# Patient Record
Sex: Female | Born: 1965 | Race: White | Hispanic: No | Smoking: Former smoker
Health system: Southern US, Community
[De-identification: ages and names within clinical notes are randomized; demographics above are authoritative.]

## PROBLEM LIST (undated history)

## (undated) DIAGNOSIS — E119 Type 2 diabetes mellitus without complications: Secondary | ICD-10-CM

## (undated) DIAGNOSIS — M150 Primary generalized (osteo)arthritis: Secondary | ICD-10-CM

## (undated) DIAGNOSIS — F32A Depression, unspecified: Secondary | ICD-10-CM

## (undated) DIAGNOSIS — J45909 Unspecified asthma, uncomplicated: Secondary | ICD-10-CM

## (undated) DIAGNOSIS — G5600 Carpal tunnel syndrome, unspecified upper limb: Secondary | ICD-10-CM

## (undated) DIAGNOSIS — F329 Major depressive disorder, single episode, unspecified: Secondary | ICD-10-CM

## (undated) DIAGNOSIS — M1990 Unspecified osteoarthritis, unspecified site: Secondary | ICD-10-CM

## (undated) HISTORY — DX: Depression, unspecified: F32.A

## (undated) HISTORY — DX: Major depressive disorder, single episode, unspecified: F32.9

## (undated) HISTORY — DX: Type 2 diabetes mellitus without complications: E11.9

## (undated) HISTORY — DX: Unspecified asthma, uncomplicated: J45.909

---
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
## 1987-03-14 HISTORY — PX: TUBAL LIGATION: SHX77

## 1998-02-18 ENCOUNTER — Other Ambulatory Visit: Admission: RE | Admit: 1998-02-18 | Discharge: 1998-02-18 | Payer: Self-pay | Admitting: *Deleted

## 1999-04-07 ENCOUNTER — Other Ambulatory Visit: Admission: RE | Admit: 1999-04-07 | Discharge: 1999-04-07 | Payer: Self-pay | Admitting: *Deleted

## 2000-07-26 ENCOUNTER — Other Ambulatory Visit: Admission: RE | Admit: 2000-07-26 | Discharge: 2000-07-26 | Payer: Self-pay | Admitting: *Deleted

## 2001-09-06 ENCOUNTER — Encounter: Payer: Self-pay | Admitting: Family Medicine

## 2001-09-06 ENCOUNTER — Ambulatory Visit (HOSPITAL_COMMUNITY): Admission: RE | Admit: 2001-09-06 | Discharge: 2001-09-06 | Payer: Self-pay | Admitting: Family Medicine

## 2002-03-11 ENCOUNTER — Other Ambulatory Visit: Admission: RE | Admit: 2002-03-11 | Discharge: 2002-03-11 | Payer: Self-pay | Admitting: Gynecology

## 2002-03-18 ENCOUNTER — Encounter: Admission: RE | Admit: 2002-03-18 | Discharge: 2002-03-18 | Payer: Self-pay | Admitting: Gynecology

## 2002-03-18 ENCOUNTER — Encounter: Payer: Self-pay | Admitting: Gynecology

## 2004-10-12 ENCOUNTER — Emergency Department (HOSPITAL_COMMUNITY): Admission: EM | Admit: 2004-10-12 | Discharge: 2004-10-12 | Payer: Self-pay | Admitting: Emergency Medicine

## 2005-05-17 ENCOUNTER — Ambulatory Visit (HOSPITAL_COMMUNITY): Admission: RE | Admit: 2005-05-17 | Discharge: 2005-05-17 | Payer: Self-pay | Admitting: Family Medicine

## 2005-06-05 ENCOUNTER — Ambulatory Visit: Payer: Self-pay | Admitting: Orthopedic Surgery

## 2006-07-04 ENCOUNTER — Other Ambulatory Visit: Admission: RE | Admit: 2006-07-04 | Discharge: 2006-07-04 | Payer: Self-pay | Admitting: Gynecology

## 2009-03-15 ENCOUNTER — Ambulatory Visit (HOSPITAL_COMMUNITY): Admission: RE | Admit: 2009-03-15 | Discharge: 2009-03-15 | Payer: Self-pay | Admitting: Family Medicine

## 2009-07-25 ENCOUNTER — Encounter: Admission: RE | Admit: 2009-07-25 | Discharge: 2009-07-25 | Payer: Self-pay | Admitting: Family Medicine

## 2010-03-21 ENCOUNTER — Other Ambulatory Visit
Admission: RE | Admit: 2010-03-21 | Discharge: 2010-03-21 | Payer: Self-pay | Source: Home / Self Care | Admitting: Obstetrics & Gynecology

## 2010-04-03 ENCOUNTER — Encounter: Payer: Self-pay | Admitting: Family Medicine

## 2011-04-19 ENCOUNTER — Other Ambulatory Visit (HOSPITAL_COMMUNITY): Payer: Self-pay | Admitting: Family Medicine

## 2011-04-19 DIAGNOSIS — Z139 Encounter for screening, unspecified: Secondary | ICD-10-CM

## 2011-04-24 ENCOUNTER — Ambulatory Visit (HOSPITAL_COMMUNITY): Payer: Self-pay

## 2011-10-09 ENCOUNTER — Ambulatory Visit (HOSPITAL_COMMUNITY)
Admission: RE | Admit: 2011-10-09 | Discharge: 2011-10-09 | Disposition: A | Discharge status: Home / Self Care | Payer: BC Managed Care – PPO | Source: Ambulatory Visit | Attending: Family Medicine | Admitting: Family Medicine

## 2011-10-09 DIAGNOSIS — Z1231 Encounter for screening mammogram for malignant neoplasm of breast: Secondary | ICD-10-CM | POA: Insufficient documentation

## 2011-10-09 DIAGNOSIS — Z139 Encounter for screening, unspecified: Secondary | ICD-10-CM

## 2012-05-03 ENCOUNTER — Other Ambulatory Visit: Payer: Self-pay | Admitting: Obstetrics and Gynecology

## 2013-08-27 ENCOUNTER — Other Ambulatory Visit (HOSPITAL_COMMUNITY): Payer: Self-pay | Admitting: Internal Medicine

## 2013-08-27 DIAGNOSIS — M549 Dorsalgia, unspecified: Secondary | ICD-10-CM

## 2013-08-29 ENCOUNTER — Ambulatory Visit (HOSPITAL_COMMUNITY): Admission: RE | Admit: 2013-08-29 | Payer: BC Managed Care – PPO | Source: Ambulatory Visit

## 2013-10-20 ENCOUNTER — Other Ambulatory Visit (HOSPITAL_COMMUNITY): Payer: Self-pay | Admitting: Internal Medicine

## 2013-10-20 DIAGNOSIS — Z139 Encounter for screening, unspecified: Secondary | ICD-10-CM

## 2013-10-27 ENCOUNTER — Ambulatory Visit (HOSPITAL_COMMUNITY)
Admission: RE | Admit: 2013-10-27 | Discharge: 2013-10-27 | Disposition: A | Discharge status: Home / Self Care | Payer: BC Managed Care – PPO | Source: Ambulatory Visit | Attending: Internal Medicine | Admitting: Internal Medicine

## 2013-10-27 DIAGNOSIS — Z139 Encounter for screening, unspecified: Secondary | ICD-10-CM

## 2013-10-27 DIAGNOSIS — Z1231 Encounter for screening mammogram for malignant neoplasm of breast: Secondary | ICD-10-CM | POA: Diagnosis present

## 2014-05-06 ENCOUNTER — Other Ambulatory Visit: Payer: Self-pay | Admitting: Orthopedic Surgery

## 2014-06-09 ENCOUNTER — Ambulatory Visit (HOSPITAL_BASED_OUTPATIENT_CLINIC_OR_DEPARTMENT_OTHER)
Admission: RE | Admit: 2014-06-09 | Payer: BLUE CROSS/BLUE SHIELD | Source: Ambulatory Visit | Admitting: Orthopedic Surgery

## 2014-06-09 ENCOUNTER — Encounter (HOSPITAL_BASED_OUTPATIENT_CLINIC_OR_DEPARTMENT_OTHER): Admission: RE | Payer: Self-pay | Source: Ambulatory Visit

## 2014-06-09 SURGERY — CARPAL TUNNEL RELEASE
Anesthesia: Choice | Laterality: Left

## 2014-10-02 ENCOUNTER — Other Ambulatory Visit (HOSPITAL_COMMUNITY): Payer: Self-pay | Admitting: Family Medicine

## 2014-10-02 ENCOUNTER — Ambulatory Visit (HOSPITAL_COMMUNITY)
Admission: RE | Admit: 2014-10-02 | Discharge: 2014-10-02 | Disposition: A | Discharge status: Home / Self Care | Payer: BLUE CROSS/BLUE SHIELD | Source: Ambulatory Visit | Attending: Family Medicine | Admitting: Family Medicine

## 2014-10-02 DIAGNOSIS — R05 Cough: Secondary | ICD-10-CM | POA: Insufficient documentation

## 2014-10-02 DIAGNOSIS — R059 Cough, unspecified: Secondary | ICD-10-CM

## 2014-10-02 DIAGNOSIS — R0602 Shortness of breath: Secondary | ICD-10-CM | POA: Insufficient documentation

## 2014-10-02 DIAGNOSIS — R109 Unspecified abdominal pain: Secondary | ICD-10-CM

## 2014-10-02 DIAGNOSIS — R079 Chest pain, unspecified: Secondary | ICD-10-CM | POA: Insufficient documentation

## 2015-12-13 ENCOUNTER — Ambulatory Visit: Payer: Self-pay | Admitting: Physician Assistant

## 2015-12-13 ENCOUNTER — Encounter: Payer: Self-pay | Admitting: Physician Assistant

## 2015-12-13 VITALS — BP 146/66 | HR 92 | Temp 98.1°F | Ht 64.25 in | Wt 236.2 lb

## 2015-12-13 DIAGNOSIS — F329 Major depressive disorder, single episode, unspecified: Secondary | ICD-10-CM | POA: Insufficient documentation

## 2015-12-13 DIAGNOSIS — F32A Depression, unspecified: Secondary | ICD-10-CM | POA: Insufficient documentation

## 2015-12-13 DIAGNOSIS — F419 Anxiety disorder, unspecified: Secondary | ICD-10-CM | POA: Insufficient documentation

## 2015-12-13 DIAGNOSIS — R059 Cough, unspecified: Secondary | ICD-10-CM

## 2015-12-13 DIAGNOSIS — F17219 Nicotine dependence, cigarettes, with unspecified nicotine-induced disorders: Secondary | ICD-10-CM

## 2015-12-13 DIAGNOSIS — Z13220 Encounter for screening for lipoid disorders: Secondary | ICD-10-CM

## 2015-12-13 DIAGNOSIS — Z1239 Encounter for other screening for malignant neoplasm of breast: Secondary | ICD-10-CM

## 2015-12-13 DIAGNOSIS — R05 Cough: Secondary | ICD-10-CM

## 2015-12-13 DIAGNOSIS — R030 Elevated blood-pressure reading, without diagnosis of hypertension: Secondary | ICD-10-CM

## 2015-12-13 DIAGNOSIS — Z131 Encounter for screening for diabetes mellitus: Secondary | ICD-10-CM

## 2015-12-13 LAB — GLUCOSE, POCT (MANUAL RESULT ENTRY): POC Glucose: 121 mg/dl — AB (ref 70–99)

## 2015-12-13 MED ORDER — CITALOPRAM HYDROBROMIDE 20 MG PO TABS: 20.0000 mg | ORAL_TABLET | Freq: Every day | ORAL | 0 refills | Status: DC

## 2015-12-13 MED ORDER — ALBUTEROL SULFATE HFA 108 (90 BASE) MCG/ACT IN AERS: 2.0000 | INHALATION_SPRAY | Freq: Four times a day (QID) | RESPIRATORY_TRACT | 1 refills | Status: DC | PRN

## 2015-12-13 NOTE — Patient Instructions (Addendum)
Citalopram (celexa) at [facility] Get labs/blood drawn (fasting) Someone will call for mammogram appointment Turn in your [facility] discount application Get chest xray (radiology department at [facility])

## 2015-12-13 NOTE — Progress Notes (Signed)
BP (!) 146/66 (BP Location: Left Arm, Patient Position: Sitting, Cuff Size: Large)   Pulse 92   Temp 98.1 F (36.7 C)   Ht 5' 4.25" (1.632 m)   Wt 236 lb 4 oz (107.2 kg)   SpO2 99%   BMI 40.24 kg/m    Subjective:    Patient ID: Lori Calderon, female    DOB: [birth year], 50 y.o.   MRN: [id number]  HPI: Lori Calderon is a 50 y.o. female presenting on 12/13/2015 for New Patient (Initial Visit) (previous pt at [facility]. pt lost employment and insurance and could no longer afford to go there)   HPI  Pt's mother with her today  Pt says it's been a year or more since she last went to [facility]  Pt was previously employed.  Pt states some depression.  In the past she was on Either lexapro or celexa. It worked well for her.   Pt states never on bp medication.   Pt c/o cough for about a year or year and a half.  Had cxr 10/02/14. Denies wheezing. Some sob- mostly when she is active.  She doesn't take anything for her cough.  She is a smoker  Relevant past medical, surgical, family and social history reviewed and updated as indicated. Interim medical history since our last visit reviewed. Allergies and medications reviewed and updated.  No current outpatient prescriptions on file.   Review of Systems  Constitutional: Negative for appetite change, chills, diaphoresis, fatigue, fever and unexpected weight change.  HENT: Negative for congestion, dental problem, drooling, ear pain, facial swelling, hearing loss, mouth sores, sneezing, sore throat, trouble swallowing and voice change.   Eyes: Negative for pain, discharge, redness, itching and visual disturbance.  Respiratory: Positive for cough and shortness of breath. Negative for choking and wheezing.   Cardiovascular: Negative for chest pain, palpitations and leg swelling.  Gastrointestinal: Negative for abdominal pain, blood in stool, constipation, diarrhea and vomiting.  Endocrine: Negative for cold intolerance, heat intolerance  and polydipsia.  Genitourinary: Negative for decreased urine volume, dysuria and hematuria.  Musculoskeletal: Positive for arthralgias, back pain and gait problem.  Skin: Negative for rash.  Allergic/Immunologic: Negative for environmental allergies.  Neurological: Positive for headaches. Negative for seizures, syncope and light-headedness.  Hematological: Negative for adenopathy.  Psychiatric/Behavioral: Positive for agitation and dysphoric mood. Negative for suicidal ideas. The patient is nervous/anxious.     Per HPI unless specifically indicated above     Objective:    BP (!) 146/66 (BP Location: Left Arm, Patient Position: Sitting, Cuff Size: Large)   Pulse 92   Temp 98.1 F (36.7 C)   Ht 5' 4.25" (1.632 m)   Wt 236 lb 4 oz (107.2 kg)   SpO2 99%   BMI 40.24 kg/m   Wt Readings from Last 3 Encounters:  12/13/15 236 lb 4 oz (107.2 kg)    phq-9 score 20 Gad-7 score 16  Physical Exam  Constitutional: She is oriented to person, place, and time. She appears well-developed and well-nourished.  HENT:  Head: Normocephalic and atraumatic.  Mouth/Throat: Oropharynx is clear and moist. No oropharyngeal exudate.  Eyes: Conjunctivae and EOM are normal. Pupils are equal, round, and reactive to light.  Neck: Neck supple. No thyromegaly present.  Cardiovascular: Normal rate and regular rhythm.   Pulmonary/Chest: Effort normal and breath sounds normal.  Abdominal: Soft. Bowel sounds are normal. She exhibits no mass. There is no hepatosplenomegaly. There is no tenderness.  Musculoskeletal: She exhibits no edema.  Lymphadenopathy:    She has no cervical adenopathy.  Neurological: She is alert and oriented to person, place, and time. Gait normal.  Skin: Skin is warm and dry.  Psychiatric: She has a normal mood and affect. Her behavior is normal.  Vitals reviewed.   Results for orders placed or performed in visit on 12/13/15  POCT Glucose (CBG)  Result Value Ref Range   POC Glucose  121 (A) 70 - 99 mg/dl      Assessment & Plan:   Encounter Diagnoses  Name Primary?  . Depression, unspecified depression type Yes  . Cough   . Cigarette nicotine dependence with nicotine-induced disorder   . Anxiety   . Elevated blood-pressure reading without diagnosis of hypertension   . Screening for diabetes mellitus (DM)   . Screening cholesterol level   . Screening for breast cancer      -order screeening Mammogram -Also needs pap- will plan on doing this in next appointment or two -pt Needs cxr -pt was given gave [facility] discount application.   -will get pt signed up for [facility] -order albuterol mdi from [facility] -pt will go to lab tomorrow morning for Baseline labs -Likely copd causing cough but need to evaluate- may need CT after [facility] discount approved -pt was given rx for citalopram.  She was given contact information for [facility] including information about walk-in services -discussed with pt about smoking cessation -follow up 3 weeks.  RTO sooner prn

## 2015-12-20 ENCOUNTER — Ambulatory Visit (HOSPITAL_COMMUNITY)
Admission: RE | Admit: 2015-12-20 | Discharge: 2015-12-20 | Disposition: A | Discharge status: Home / Self Care | Payer: BLUE CROSS/BLUE SHIELD | Source: Ambulatory Visit | Attending: Physician Assistant | Admitting: Physician Assistant

## 2015-12-21 LAB — CBC WITH DIFFERENTIAL/PLATELET
Basophils Absolute: 42 cells/uL (ref 0–200)
Basophils Relative: 1 %
Eosinophils Absolute: 126 cells/uL (ref 15–500)
Eosinophils Relative: 3 %
HCT: 41.7 % (ref 35.0–45.0)
Hemoglobin: 13.8 g/dL (ref 11.7–15.5)
Lymphocytes Relative: 42 %
Lymphs Abs: 1764 cells/uL (ref 850–3900)
MCH: 30.1 pg (ref 27.0–33.0)
MCHC: 33.1 g/dL (ref 32.0–36.0)
MCV: 90.8 fL (ref 80.0–100.0)
MPV: 8.7 fL (ref 7.5–12.5)
Monocytes Absolute: 210 cells/uL (ref 200–950)
Monocytes Relative: 5 %
Neutro Abs: 2058 cells/uL (ref 1500–7800)
Neutrophils Relative %: 49 %
Platelets: 259 10*3/uL (ref 140–400)
RBC: 4.59 MIL/uL (ref 3.80–5.10)
RDW: 14.5 % (ref 11.0–15.0)
WBC: 4.2 10*3/uL (ref 3.8–10.8)

## 2015-12-22 LAB — COMPREHENSIVE METABOLIC PANEL
ALT: 27 U/L (ref 6–29)
AST: 22 U/L (ref 10–35)
Albumin: 4 g/dL (ref 3.6–5.1)
Alkaline Phosphatase: 75 U/L (ref 33–130)
BUN: 15 mg/dL (ref 7–25)
CO2: 27 mmol/L (ref 20–31)
Calcium: 9.1 mg/dL (ref 8.6–10.4)
Chloride: 103 mmol/L (ref 98–110)
Creat: 0.88 mg/dL (ref 0.50–1.05)
Glucose, Bld: 89 mg/dL (ref 65–99)
Potassium: 4.8 mmol/L (ref 3.5–5.3)
Sodium: 137 mmol/L (ref 135–146)
Total Bilirubin: 0.5 mg/dL (ref 0.2–1.2)
Total Protein: 6.7 g/dL (ref 6.1–8.1)

## 2015-12-22 LAB — HEMOGLOBIN A1C
Hgb A1c MFr Bld: 5.6 % (ref <5.7)
Mean Plasma Glucose: 114 mg/dL

## 2015-12-22 LAB — LIPID PANEL
Cholesterol: 207 mg/dL — ABNORMAL HIGH (ref 125–200)
HDL: 31 mg/dL — ABNORMAL LOW (ref >=46)
LDL Cholesterol: 140 mg/dL — ABNORMAL HIGH (ref <130)
Total CHOL/HDL Ratio: 6.7 Ratio — ABNORMAL HIGH (ref <=5.0)
Triglycerides: 182 mg/dL — ABNORMAL HIGH (ref <150)
VLDL: 36 mg/dL — ABNORMAL HIGH (ref <30)

## 2015-12-22 LAB — TSH: TSH: 1.89 mIU/L

## 2016-01-03 ENCOUNTER — Encounter: Payer: Self-pay | Admitting: Physician Assistant

## 2016-01-03 ENCOUNTER — Ambulatory Visit: Payer: Self-pay | Admitting: Physician Assistant

## 2016-01-03 VITALS — BP 116/60 | HR 94 | Temp 97.9°F | Wt 238.5 lb

## 2016-01-03 DIAGNOSIS — F32A Depression, unspecified: Secondary | ICD-10-CM

## 2016-01-03 DIAGNOSIS — J449 Chronic obstructive pulmonary disease, unspecified: Secondary | ICD-10-CM

## 2016-01-03 DIAGNOSIS — R05 Cough: Secondary | ICD-10-CM

## 2016-01-03 DIAGNOSIS — R059 Cough, unspecified: Secondary | ICD-10-CM

## 2016-01-03 DIAGNOSIS — F17219 Nicotine dependence, cigarettes, with unspecified nicotine-induced disorders: Secondary | ICD-10-CM

## 2016-01-03 DIAGNOSIS — E785 Hyperlipidemia, unspecified: Secondary | ICD-10-CM

## 2016-01-03 DIAGNOSIS — F329 Major depressive disorder, single episode, unspecified: Secondary | ICD-10-CM

## 2016-01-03 MED ORDER — CITALOPRAM HYDROBROMIDE 20 MG PO TABS: 20.0000 mg | ORAL_TABLET | Freq: Every day | ORAL | 0 refills | Status: DC

## 2016-01-03 NOTE — Patient Instructions (Signed)

## 2016-01-03 NOTE — Progress Notes (Signed)
BP 116/60   Pulse 94   Temp 97.9 F (36.6 C)   Wt 238 lb 8 oz (108.2 kg)   SpO2 93%   BMI 40.62 kg/m    Subjective:    Patient ID: Lori Calderon, female    DOB: 07/26/1965, 50 y.o.   MRN: [id number]  HPI: Lori Calderon is a 50 y.o. female presenting on 01/03/2016 for No chief complaint on file.   HPI   Pt did not get cxr.  She says she turned in her [facility] discount application  She did not contact [facility].  She says citalopram is helping her mood.  She says it makes her thirsty but her mood is much better.   Relevant past medical, surgical, family and social history reviewed and updated as indicated. Interim medical history since our last visit reviewed. Allergies and medications reviewed and updated.   Current Outpatient Prescriptions:  .  citalopram (CELEXA) 20 MG tablet, Take 1 tablet (20 mg total) by mouth daily., Disp: 30 tablet, Rfl: 0 .  albuterol (PROVENTIL HFA;VENTOLIN HFA) 108 (90 Base) MCG/ACT inhaler, Inhale 2 puffs into the lungs every 6 (six) hours as needed for wheezing or shortness of breath. (Patient not taking: Reported on 01/03/2016), Disp: 3 Inhaler, Rfl: 1   Review of Systems  Constitutional: Positive for appetite change, diaphoresis and fatigue. Negative for chills, fever and unexpected weight change.  HENT: Negative for congestion, drooling, ear pain, facial swelling, hearing loss, mouth sores, sneezing, sore throat, trouble swallowing and voice change.   Eyes: Negative for pain, discharge, redness, itching and visual disturbance.  Respiratory: Positive for cough and shortness of breath. Negative for choking and wheezing.   Cardiovascular: Negative for chest pain, palpitations and leg swelling.  Gastrointestinal: Negative for abdominal pain, blood in stool, constipation, diarrhea and vomiting.  Endocrine: Positive for polydipsia. Negative for cold intolerance and heat intolerance.  Genitourinary: Negative for decreased urine volume, dysuria and  hematuria.  Musculoskeletal: Negative for arthralgias, back pain and gait problem.  Skin: Negative for rash.  Allergic/Immunologic: Negative for environmental allergies.  Neurological: Negative for seizures, syncope, light-headedness and headaches.  Hematological: Negative for adenopathy.  Psychiatric/Behavioral: Positive for agitation and dysphoric mood. Negative for suicidal ideas. The patient is nervous/anxious.     Per HPI unless specifically indicated above     Objective:    BP 116/60   Pulse 94   Temp 97.9 F (36.6 C)   Wt 238 lb 8 oz (108.2 kg)   SpO2 93%   BMI 40.62 kg/m   Wt Readings from Last 3 Encounters:  01/03/16 238 lb 8 oz (108.2 kg)  12/13/15 236 lb 4 oz (107.2 kg)     phq-9 score 5 Gad-7 score 6  Physical Exam  Constitutional: She is oriented to person, place, and time. She appears well-developed and well-nourished.  HENT:  Head: Normocephalic and atraumatic.  Neck: Neck supple.  Cardiovascular: Normal rate and regular rhythm.   Pulmonary/Chest: Effort normal and breath sounds normal.  Abdominal: Soft. Bowel sounds are normal. She exhibits no mass. There is no hepatosplenomegaly. There is no tenderness.  Musculoskeletal: She exhibits no edema.  Lymphadenopathy:    She has no cervical adenopathy.  Neurological: She is alert and oriented to person, place, and time.  Skin: Skin is warm and dry.  Psychiatric: She has a normal mood and affect. Her behavior is normal.  Vitals reviewed.   Results for orders placed or performed in visit on 12/13/15  Lipid Profile  Result Value Ref Range   Cholesterol 207 (H) 125 - 200 mg/dL   Triglycerides 182 (H) <150 mg/dL   HDL 31 (L) >=46 mg/dL   Total CHOL/HDL Ratio 6.7 (H) <=5.0 Ratio   VLDL 36 (H) <30 mg/dL   LDL Cholesterol 140 (H) <130 mg/dL  CBC w/Diff/Platelet  Result Value Ref Range   WBC 4.2 3.8 - 10.8 K/uL   RBC 4.59 3.80 - 5.10 MIL/uL   Hemoglobin 13.8 11.7 - 15.5 g/dL   HCT 41.7 35.0 - 45.0 %    MCV 90.8 80.0 - 100.0 fL   MCH 30.1 27.0 - 33.0 pg   MCHC 33.1 32.0 - 36.0 g/dL   RDW 14.5 11.0 - 15.0 %   Platelets 259 140 - 400 K/uL   MPV 8.7 7.5 - 12.5 fL   Neutro Abs 2,058 1,500 - 7,800 cells/uL   Lymphs Abs 1,764 850 - 3,900 cells/uL   Monocytes Absolute 210 200 - 950 cells/uL   Eosinophils Absolute 126 15 - 500 cells/uL   Basophils Absolute 42 0 - 200 cells/uL   Neutrophils Relative % 49 %   Lymphocytes Relative 42 %   Monocytes Relative 5 %   Eosinophils Relative 3 %   Basophils Relative 1 %   Smear Review Criteria for review not met   Comprehensive Metabolic Panel (CMET)  Result Value Ref Range   Sodium 137 135 - 146 mmol/L   Potassium 4.8 3.5 - 5.3 mmol/L   Chloride 103 98 - 110 mmol/L   CO2 27 20 - 31 mmol/L   Glucose, Bld 89 65 - 99 mg/dL   BUN 15 7 - 25 mg/dL   Creat 0.88 0.50 - 1.05 mg/dL   Total Bilirubin 0.5 0.2 - 1.2 mg/dL   Alkaline Phosphatase 75 33 - 130 U/L   AST 22 10 - 35 U/L   ALT 27 6 - 29 U/L   Total Protein 6.7 6.1 - 8.1 g/dL   Albumin 4.0 3.6 - 5.1 g/dL   Calcium 9.1 8.6 - 10.4 mg/dL  TSH  Result Value Ref Range   TSH 1.89 mIU/L  HgB A1c  Result Value Ref Range   Hgb A1c MFr Bld 5.6 <5.7 %   Mean Plasma Glucose 114 mg/dL  POCT Glucose (CBG)  Result Value Ref Range   POC Glucose 121 (A) 70 - 99 mg/dl      Assessment & Plan:   Encounter Diagnoses  Name Primary?  . Depression, unspecified depression type Yes  . Cough   . Cigarette nicotine dependence with nicotine-induced disorder   . Hyperlipidemia, unspecified hyperlipidemia type   . Chronic obstructive pulmonary disease, unspecified COPD type ([facility])     -Reviewed labs with pt -rx Atorvastatin and lowfat diet for lipids -Continue citalopram. Recommended again that pt contact [facility] for counseling -pt counseled to bring in Paper missing for [facility].   -pt to get cxr to evaluate cough -f/u one moth to review cxr, check mood, and do PAP.  RTO sooner prn

## 2016-01-05 ENCOUNTER — Ambulatory Visit (HOSPITAL_COMMUNITY)
Admission: RE | Admit: 2016-01-05 | Discharge: 2016-01-05 | Disposition: A | Discharge status: Home / Self Care | Payer: BLUE CROSS/BLUE SHIELD | Source: Ambulatory Visit | Attending: Physician Assistant | Admitting: Physician Assistant

## 2016-01-05 DIAGNOSIS — R05 Cough: Secondary | ICD-10-CM | POA: Insufficient documentation

## 2016-02-07 ENCOUNTER — Ambulatory Visit: Payer: Self-pay | Admitting: Physician Assistant

## 2016-02-07 ENCOUNTER — Encounter: Payer: Self-pay | Admitting: Physician Assistant

## 2016-02-07 ENCOUNTER — Other Ambulatory Visit: Payer: Self-pay | Admitting: Physician Assistant

## 2016-02-07 VITALS — BP 116/60 | HR 105 | Temp 97.3°F | Ht 64.25 in | Wt 241.5 lb

## 2016-02-07 DIAGNOSIS — Z1211 Encounter for screening for malignant neoplasm of colon: Secondary | ICD-10-CM

## 2016-02-07 DIAGNOSIS — F32A Depression, unspecified: Secondary | ICD-10-CM

## 2016-02-07 DIAGNOSIS — E785 Hyperlipidemia, unspecified: Secondary | ICD-10-CM

## 2016-02-07 DIAGNOSIS — F329 Major depressive disorder, single episode, unspecified: Secondary | ICD-10-CM

## 2016-02-07 DIAGNOSIS — J449 Chronic obstructive pulmonary disease, unspecified: Secondary | ICD-10-CM

## 2016-02-07 DIAGNOSIS — F419 Anxiety disorder, unspecified: Secondary | ICD-10-CM

## 2016-02-07 DIAGNOSIS — F17219 Nicotine dependence, cigarettes, with unspecified nicotine-induced disorders: Secondary | ICD-10-CM

## 2016-02-07 DIAGNOSIS — Z124 Encounter for screening for malignant neoplasm of cervix: Secondary | ICD-10-CM

## 2016-02-07 DIAGNOSIS — R059 Cough, unspecified: Secondary | ICD-10-CM

## 2016-02-07 DIAGNOSIS — R05 Cough: Secondary | ICD-10-CM

## 2016-02-07 MED ORDER — CITALOPRAM HYDROBROMIDE 20 MG PO TABS: 20.0000 mg | ORAL_TABLET | Freq: Every day | ORAL | 1 refills | Status: DC

## 2016-02-07 MED ORDER — ATORVASTATIN CALCIUM 20 MG PO TABS: 20.0000 mg | ORAL_TABLET | Freq: Every day | ORAL | 2 refills | Status: DC

## 2016-02-07 NOTE — Progress Notes (Signed)
BP 116/60 (BP Location: Left Arm, Patient Position: Sitting, Cuff Size: Normal)   Pulse (!) 105   Temp 97.3 F (36.3 C)   Ht 5' 4.25" (1.632 m)   Wt 241 lb 8 oz (109.5 kg)   SpO2 98%   BMI 41.13 kg/m    Subjective:    Patient ID: Lori Calderon, female    DOB: December 16, 1965, 50 y.o.   MRN: [id number]  HPI: Lori Calderon is a 50 y.o. female presenting on 02/07/2016 for Cough; Mental Health Problem; Gynecologic Exam; and Back Pain   HPI   Pt got her inhalers in the mail from [facility].   Pt doesn't feel like she needs to contact [facility]- says the citalopram has her feeling back to her normal self.   Reviewed cxr- normal. Pt states cough improved since she got the inahaler.  She is using the inhaler once or twice every day.  She is still smoking.  She is working on trying to quit.   Relevant past medical, surgical, family and social history reviewed and updated as indicated. Interim medical history since our last visit reviewed. Allergies and medications reviewed and updated.  CURRENT MEDS: Albuterol mdi Citalopram 20mg  qd  Review of Systems  Constitutional: Positive for appetite change and fatigue. Negative for chills, diaphoresis, fever and unexpected weight change.  HENT: Negative for congestion, dental problem, drooling, ear pain, facial swelling, hearing loss, mouth sores, sneezing, sore throat, trouble swallowing and voice change.   Eyes: Negative for pain, discharge, redness, itching and visual disturbance.  Respiratory: Positive for cough, chest tightness and shortness of breath. Negative for choking and wheezing.   Cardiovascular: Negative for chest pain, palpitations and leg swelling.  Gastrointestinal: Negative for abdominal pain, blood in stool, constipation, diarrhea and vomiting.  Endocrine: Positive for heat intolerance. Negative for cold intolerance and polydipsia.  Genitourinary: Negative for decreased urine volume, dysuria and hematuria.  Musculoskeletal:  Positive for arthralgias, back pain and gait problem.  Skin: Negative for rash.  Allergic/Immunologic: Negative for environmental allergies.  Neurological: Positive for headaches. Negative for seizures, syncope and light-headedness.  Hematological: Negative for adenopathy.  Psychiatric/Behavioral: Positive for dysphoric mood. Negative for agitation and suicidal ideas. The patient is nervous/anxious.     Per HPI unless specifically indicated above     Objective:    BP 116/60 (BP Location: Left Arm, Patient Position: Sitting, Cuff Size: Normal)   Pulse (!) 105   Temp 97.3 F (36.3 C)   Ht 5' 4.25" (1.632 m)   Wt 241 lb 8 oz (109.5 kg)   SpO2 98%   BMI 41.13 kg/m   Wt Readings from Last 3 Encounters:  02/07/16 241 lb 8 oz (109.5 kg)  01/03/16 238 lb 8 oz (108.2 kg)  12/13/15 236 lb 4 oz (107.2 kg)    Physical Exam  Constitutional: She is oriented to person, place, and time. She appears well-developed and well-nourished.  HENT:  Head: Normocephalic and atraumatic.  Neck: Neck supple.  Cardiovascular: Normal rate and regular rhythm.   Pulmonary/Chest: Effort normal and breath sounds normal.  Breast exam normal  Abdominal: Soft. Bowel sounds are normal. She exhibits no mass. There is no hepatosplenomegaly. There is no tenderness. There is no rebound and no guarding.  Genitourinary: Vagina normal and uterus normal. No breast swelling, tenderness, discharge or bleeding. There is no rash, tenderness or lesion on the right labia. There is no rash, tenderness or lesion on the left labia. Cervix exhibits no motion tenderness, no discharge  and no friability. Right adnexum displays no mass, no tenderness and no fullness. Left adnexum displays no mass, no tenderness and no fullness.  Genitourinary Comments: (nurse Berenice assisted)  Musculoskeletal: She exhibits no edema.  Lymphadenopathy:    She has no cervical adenopathy.  Neurological: She is alert and oriented to person, place, and  time.  Skin: Skin is warm and dry.  Psychiatric: She has a normal mood and affect. Her behavior is normal.  Nursing note and vitals reviewed.       Assessment & Plan:   Encounter Diagnoses  Name Primary?  . Depression, unspecified depression type Yes  . Cervical cancer screening   . Cough   . Chronic obstructive pulmonary disease, unspecified COPD type (HCC)   . Cigarette nicotine dependence with nicotine-induced disorder   . Hyperlipidemia, unspecified hyperlipidemia type   . Anxiety   . Special screening for malignant neoplasms, colon   . Obesity, morbid (HCC)     -Reviewed cxr results -Pt to continue citalopram -lipitor to come from [facility]. will Recheck 3 mo -iFOBT given for colon cancer screening -Discussed may need maintenance inhaler if continues to need albuterol so often.  May improve with smoking cessation. counseled -F/u 6 wk check mood. RTO sooner prn

## 2016-02-09 LAB — PAP, THINPREP RFLX HPV

## 2016-02-14 LAB — IFOBT (OCCULT BLOOD): IFOBT: NEGATIVE

## 2016-03-20 ENCOUNTER — Ambulatory Visit: Payer: Self-pay | Admitting: Physician Assistant

## 2016-03-20 ENCOUNTER — Encounter: Payer: Self-pay | Admitting: Physician Assistant

## 2016-03-20 VITALS — BP 130/66 | HR 108 | Temp 98.1°F | Ht 64.25 in | Wt 253.5 lb

## 2016-03-20 DIAGNOSIS — J449 Chronic obstructive pulmonary disease, unspecified: Secondary | ICD-10-CM

## 2016-03-20 DIAGNOSIS — F32A Depression, unspecified: Secondary | ICD-10-CM

## 2016-03-20 DIAGNOSIS — F329 Major depressive disorder, single episode, unspecified: Secondary | ICD-10-CM

## 2016-03-20 DIAGNOSIS — E785 Hyperlipidemia, unspecified: Secondary | ICD-10-CM

## 2016-03-20 DIAGNOSIS — F17219 Nicotine dependence, cigarettes, with unspecified nicotine-induced disorders: Secondary | ICD-10-CM

## 2016-03-20 NOTE — Progress Notes (Signed)
BP 130/66   Pulse (!) 108   Temp 98.1 F (36.7 C)   Ht 5' 4.25" (1.632 m)   Wt 253 lb 8 oz (115 kg)   SpO2 97%   BMI 43.18 kg/m    Subjective:    Patient ID: Lori Calderon, female    DOB: 1965-06-19, 51 y.o.   MRN: [id number]  HPI: Lori Calderon is a 51 y.o. female presenting on 03/20/2016 for Mental Health Problem   HPI   She is using her inhaler about 3 times/week now.  She is still smoking.  She says her mood is doing better.  She is likeing the citalopram.`  Relevant past medical, surgical, family and social history reviewed and updated as indicated. Interim medical history since our last visit reviewed. Allergies and medications reviewed and updated.   Current Outpatient Prescriptions:  .  albuterol (PROVENTIL HFA;VENTOLIN HFA) 108 (90 Base) MCG/ACT inhaler, Inhale 2 puffs into the lungs every 6 (six) hours as needed for wheezing or shortness of breath., Disp: 3 Inhaler, Rfl: 1 .  atorvastatin (LIPITOR) 20 MG tablet, Take 1 tablet (20 mg total) by mouth daily., Disp: 90 tablet, Rfl: 2 .  citalopram (CELEXA) 20 MG tablet, Take 1 tablet (20 mg total) by mouth daily., Disp: 90 tablet, Rfl: 1   Review of Systems  Constitutional: Positive for fatigue and unexpected weight change. Negative for appetite change, chills, diaphoresis and fever.  HENT: Negative for congestion, drooling, ear pain, facial swelling, hearing loss, mouth sores, sneezing, sore throat, trouble swallowing and voice change.   Eyes: Negative for pain, discharge, redness, itching and visual disturbance.  Respiratory: Positive for cough and shortness of breath. Negative for choking and wheezing.   Cardiovascular: Negative for chest pain, palpitations and leg swelling.  Gastrointestinal: Negative for abdominal pain, blood in stool, constipation, diarrhea and vomiting.  Endocrine: Positive for polydipsia. Negative for cold intolerance and heat intolerance.  Genitourinary: Negative for decreased urine  volume, dysuria and hematuria.  Musculoskeletal: Positive for arthralgias and back pain. Negative for gait problem.  Skin: Negative for rash.  Allergic/Immunologic: Negative for environmental allergies.  Neurological: Negative for seizures, syncope, light-headedness and headaches.  Hematological: Negative for adenopathy.  Psychiatric/Behavioral: Negative for agitation, dysphoric mood and suicidal ideas. The patient is not nervous/anxious.     Per HPI unless specifically indicated above     Objective:    BP 130/66   Pulse (!) 108   Temp 98.1 F (36.7 C)   Ht 5' 4.25" (1.632 m)   Wt 253 lb 8 oz (115 kg)   SpO2 97%   BMI 43.18 kg/m   Wt Readings from Last 3 Encounters:  03/20/16 253 lb 8 oz (115 kg)  02/07/16 241 lb 8 oz (109.5 kg)  01/03/16 238 lb 8 oz (108.2 kg)    Physical Exam  Constitutional: She is oriented to person, place, and time. She appears well-developed and well-nourished.  HENT:  Head: Normocephalic and atraumatic.  Neck: Neck supple.  Cardiovascular: Normal rate and regular rhythm.   Pulmonary/Chest: Effort normal and breath sounds normal.  Abdominal: Soft. Bowel sounds are normal. She exhibits no mass. There is no hepatosplenomegaly. There is no tenderness.  Musculoskeletal: She exhibits no edema.  Lymphadenopathy:    She has no cervical adenopathy.  Neurological: She is alert and oriented to person, place, and time.  Skin: Skin is warm and dry.  Psychiatric: She has a normal mood and affect. Her behavior is normal.  Vitals reviewed.  Assessment & Plan:    Encounter Diagnoses  Name Primary?  . Depression, unspecified depression type Yes  . Chronic obstructive pulmonary disease, unspecified COPD type ([facility])   . Cigarette nicotine dependence with nicotine-induced disorder   . Hyperlipidemia, unspecified hyperlipidemia type   . Obesity, morbid ([facility])     -pt to continue current medications -counseled smoking cessation -follow up in 6 weeks  with recheck lipids before appointment.  RTO sooner prn

## 2016-04-27 ENCOUNTER — Other Ambulatory Visit: Payer: Self-pay

## 2016-04-27 DIAGNOSIS — E785 Hyperlipidemia, unspecified: Secondary | ICD-10-CM

## 2016-05-02 ENCOUNTER — Ambulatory Visit: Payer: Self-pay | Admitting: Physician Assistant

## 2016-05-04 ENCOUNTER — Encounter: Payer: Self-pay | Admitting: Physician Assistant

## 2016-05-04 ENCOUNTER — Ambulatory Visit: Payer: Self-pay | Admitting: Physician Assistant

## 2016-05-04 VITALS — BP 124/72 | HR 93 | Temp 97.7°F | Ht 64.25 in | Wt 253.0 lb

## 2016-05-04 DIAGNOSIS — J449 Chronic obstructive pulmonary disease, unspecified: Secondary | ICD-10-CM

## 2016-05-04 DIAGNOSIS — F329 Major depressive disorder, single episode, unspecified: Secondary | ICD-10-CM

## 2016-05-04 DIAGNOSIS — F32A Depression, unspecified: Secondary | ICD-10-CM

## 2016-05-04 DIAGNOSIS — F17219 Nicotine dependence, cigarettes, with unspecified nicotine-induced disorders: Secondary | ICD-10-CM

## 2016-05-04 DIAGNOSIS — M545 Low back pain, unspecified: Secondary | ICD-10-CM

## 2016-05-04 DIAGNOSIS — E785 Hyperlipidemia, unspecified: Secondary | ICD-10-CM

## 2016-05-04 MED ORDER — DICLOFENAC SODIUM 75 MG PO TBEC: 75.0000 mg | DELAYED_RELEASE_TABLET | Freq: Two times a day (BID) | ORAL | 1 refills | Status: DC | PRN

## 2016-05-04 NOTE — Progress Notes (Signed)
BP 124/72 (BP Location: Left Arm, Patient Position: Sitting, Cuff Size: Large)   Pulse 93   Temp 97.7 F (36.5 C) (Other (Comment))   Ht 5' 4.25" (1.632 m)   Wt 253 lb (114.8 kg)   SpO2 98%   BMI 43.09 kg/m    Subjective:    Patient ID: Lori Calderon, female    DOB: 10-27-1965, 51 y.o.   MRN: [id number]  HPI: Lori Calderon is a 51 y.o. female presenting on 05/04/2016 for Hyperlipidemia and Mental Health Problem   HPI   Pt did not get labs drawn.  Says she just never got around to it.  Her mother says pt just doesn't like to be late for work.   Pt works at [employer].    Her mood is good.  States is sleepy sometimes.      Pt c/o lower back pain, has had trouble walking after standing all day at work, also has carpel tunnel problems  Pt had abnormal MRI 07/25/09.  Pt stands at work- she has a mat .  She wears sneakers with inserts.   She takes aleve which she says "takes the edge off".    Pt still smoking.  She says her Breathing "isn't too bad".    Relevant past medical, surgical, family and social history reviewed and updated as indicated. Interim medical history since our last visit reviewed. Allergies and medications reviewed and updated.   Current Outpatient Prescriptions:  .  albuterol (PROVENTIL HFA;VENTOLIN HFA) 108 (90 Base) MCG/ACT inhaler, Inhale 2 puffs into the lungs every 6 (six) hours as needed for wheezing or shortness of breath., Disp: 3 Inhaler, Rfl: 1 .  atorvastatin (LIPITOR) 20 MG tablet, Take 1 tablet (20 mg total) by mouth daily., Disp: 90 tablet, Rfl: 2 .  citalopram (CELEXA) 20 MG tablet, Take 1 tablet (20 mg total) by mouth daily., Disp: 90 tablet, Rfl: 1   Review of Systems  Constitutional: Positive for diaphoresis, fatigue and unexpected weight change. Negative for appetite change, chills and fever.  HENT: Negative for congestion, dental problem, drooling, ear pain, facial swelling, hearing loss, mouth sores, sneezing, sore throat, trouble  swallowing and voice change.   Eyes: Positive for redness and itching. Negative for pain, discharge and visual disturbance.  Respiratory: Positive for cough and shortness of breath. Negative for choking and wheezing.   Cardiovascular: Negative for chest pain, palpitations and leg swelling.  Gastrointestinal: Negative for abdominal pain, blood in stool, constipation, diarrhea and vomiting.  Endocrine: Positive for heat intolerance and polydipsia. Negative for cold intolerance.  Genitourinary: Negative for decreased urine volume, dysuria and hematuria.  Musculoskeletal: Positive for back pain and gait problem. Negative for arthralgias.  Skin: Negative for rash.  Allergic/Immunologic: Negative for environmental allergies.  Neurological: Positive for headaches. Negative for seizures, syncope and light-headedness.  Hematological: Negative for adenopathy.  Psychiatric/Behavioral: Negative for agitation, dysphoric mood and suicidal ideas. The patient is not nervous/anxious.     Per HPI unless specifically indicated above     Objective:    BP 124/72 (BP Location: Left Arm, Patient Position: Sitting, Cuff Size: Large)   Pulse 93   Temp 97.7 F (36.5 C) (Other (Comment))   Ht 5' 4.25" (1.632 m)   Wt 253 lb (114.8 kg)   SpO2 98%   BMI 43.09 kg/m   Wt Readings from Last 3 Encounters:  05/04/16 253 lb (114.8 kg)  03/20/16 253 lb 8 oz (115 kg)  02/07/16 241 lb 8 oz (109.5 kg)  Physical Exam  Constitutional: She is oriented to person, place, and time. She appears well-developed and well-nourished.  HENT:  Head: Normocephalic and atraumatic.  Neck: Neck supple.  Cardiovascular: Normal rate and regular rhythm.   Pulmonary/Chest: Effort normal and breath sounds normal.  Abdominal: Soft. Bowel sounds are normal. She exhibits no mass. There is no hepatosplenomegaly. There is no tenderness.  Musculoskeletal: She exhibits no edema.       Lumbar back: She exhibits tenderness. She exhibits  normal range of motion, no bony tenderness, no swelling, no edema and no deformity.  Lymphadenopathy:    She has no cervical adenopathy.  Neurological: She is alert and oriented to person, place, and time.  Skin: Skin is warm and dry.  Psychiatric: She has a normal mood and affect. Her behavior is normal.  Vitals reviewed.       Assessment & Plan:   Encounter Diagnoses  Name Primary?  . Depression, unspecified depression type Yes  . Hyperlipidemia, unspecified hyperlipidemia type   . Chronic obstructive pulmonary disease, unspecified COPD type (HCC)   . Cigarette nicotine dependence with nicotine-induced disorder   . Obesity, morbid (HCC)   . Low back pain without sciatica, unspecified back pain laterality, unspecified chronicity      -discussed with pt to Try taking citalopram at different time to help with the sleepiness. -will get MRI back -pt is given [facility] discount application -discussed may likely refer to orthopedics for the back -counseled on back exercises to help with the pain.  counseled that weight loss would also help.   rx diclofenac and cautioned to take with food -pt to get labs drawn on Saturday morning -follow up one month.   RTO sooner prn

## 2016-05-04 NOTE — Patient Instructions (Addendum)
Get labs/blood drawn Turn in [facility] application Someone will call you with MRI appointment   Back Exercises The following exercises strengthen the muscles that help to support the back. They also help to keep the lower back flexible. Doing these exercises can help to prevent back pain or lessen existing pain. If you have back pain or discomfort, try doing these exercises 2-3 times each day or as told by your health care provider. When the pain goes away, do them once each day, but increase the number of times that you repeat the steps for each exercise (do more repetitions). If you do not have back pain or discomfort, do these exercises once each day or as told by your health care provider. Exercises Single Knee to Chest  Repeat these steps 3-5 times for each leg: 1. Lie on your back on a firm bed or the floor with your legs extended. 2. Bring one knee to your chest. Your other leg should stay extended and in contact with the floor. 3. Hold your knee in place by grabbing your knee or thigh. 4. Pull on your knee until you feel a gentle stretch in your lower back. 5. Hold the stretch for 10-30 seconds. 6. Slowly release and straighten your leg. Pelvic Tilt  Repeat these steps 5-10 times: 1. Lie on your back on a firm bed or the floor with your legs extended. 2. Bend your knees so they are pointing toward the ceiling and your feet are flat on the floor. 3. Tighten your lower abdominal muscles to press your lower back against the floor. This motion will tilt your pelvis so your tailbone points up toward the ceiling instead of pointing to your feet or the floor. 4. With gentle tension and even breathing, hold this position for 5-10 seconds. Cat-Cow  Repeat these steps until your lower back becomes more flexible: 1. Get into a hands-and-knees position on a firm surface. Keep your hands under your shoulders, and keep your knees under your hips. You may place padding under your knees for  comfort. 2. Let your head hang down, and point your tailbone toward the floor so your lower back becomes rounded like the back of a cat. 3. Hold this position for 5 seconds. 4. Slowly lift your head and point your tailbone up toward the ceiling so your back forms a sagging arch like the back of a cow. 5. Hold this position for 5 seconds. Press-Ups  Repeat these steps 5-10 times: 1. Lie on your abdomen (face-down) on the floor. 2. Place your palms near your head, about shoulder-width apart. 3. While you keep your back as relaxed as possible and keep your hips on the floor, slowly straighten your arms to raise the top half of your body and lift your shoulders. Do not use your back muscles to raise your upper torso. You may adjust the placement of your hands to make yourself more comfortable. 4. Hold this position for 5 seconds while you keep your back relaxed. 5. Slowly return to lying flat on the floor. Bridges  Repeat these steps 10 times: 1. Lie on your back on a firm surface. 2. Bend your knees so they are pointing toward the ceiling and your feet are flat on the floor. 3. Tighten your buttocks muscles and lift your buttocks off of the floor until your waist is at almost the same height as your knees. You should feel the muscles working in your buttocks and the back of your thighs. If  you do not feel these muscles, slide your feet 1-2 inches farther away from your buttocks. 4. Hold this position for 3-5 seconds. 5. Slowly lower your hips to the starting position, and allow your buttocks muscles to relax completely. If this exercise is too easy, try doing it with your arms crossed over your chest. Abdominal Crunches  Repeat these steps 5-10 times: 1. Lie on your back on a firm bed or the floor with your legs extended. 2. Bend your knees so they are pointing toward the ceiling and your feet are flat on the floor. 3. Cross your arms over your chest. 4. Tip your chin slightly toward your chest  without bending your neck. 5. Tighten your abdominal muscles and slowly raise your trunk (torso) high enough to lift your shoulder blades a tiny bit off of the floor. Avoid raising your torso higher than that, because it can put too much stress on your low back and it does not help to strengthen your abdominal muscles. 6. Slowly return to your starting position. Back Lifts  Repeat these steps 5-10 times: 1. Lie on your abdomen (face-down) with your arms at your sides, and rest your forehead on the floor. 2. Tighten the muscles in your legs and your buttocks. 3. Slowly lift your chest off of the floor while you keep your hips pressed to the floor. Keep the back of your head in line with the curve in your back. Your eyes should be looking at the floor. 4. Hold this position for 3-5 seconds. 5. Slowly return to your starting position. Contact a health care provider if:  Your back pain or discomfort gets much worse when you do an exercise.  Your back pain or discomfort does not lessen within 2 hours after you exercise. If you have any of these problems, stop doing these exercises right away. Do not do them again unless your health care provider says that you can. Get help right away if:  You develop sudden, severe back pain. If this happens, stop doing the exercises right away. Do not do them again unless your health care provider says that you can. This information is not intended to replace advice given to you by your health care provider. Make sure you discuss any questions you have with your health care provider. Document Released: 04/06/2004 Document Revised: 07/07/2015 Document Reviewed: 04/23/2014 Elsevier Interactive Patient Education  2017 [employer].

## 2016-05-06 LAB — LIPID PANEL
Cholesterol: 121 mg/dL (ref <200)
HDL: 31 mg/dL — ABNORMAL LOW (ref >50)
LDL Cholesterol: 68 mg/dL (ref <100)
Total CHOL/HDL Ratio: 3.9 Ratio (ref <5.0)
Triglycerides: 108 mg/dL (ref <150)
VLDL: 22 mg/dL (ref <30)

## 2016-05-06 LAB — COMPREHENSIVE METABOLIC PANEL
ALT: 27 U/L (ref 6–29)
AST: 24 U/L (ref 10–35)
Albumin: 3.8 g/dL (ref 3.6–5.1)
Alkaline Phosphatase: 74 U/L (ref 33–130)
BUN: 20 mg/dL (ref 7–25)
CO2: 27 mmol/L (ref 20–31)
Calcium: 8.9 mg/dL (ref 8.6–10.4)
Chloride: 107 mmol/L (ref 98–110)
Creat: 0.86 mg/dL (ref 0.50–1.05)
Glucose, Bld: 91 mg/dL (ref 65–99)
Potassium: 4.8 mmol/L (ref 3.5–5.3)
Sodium: 140 mmol/L (ref 135–146)
Total Bilirubin: 0.6 mg/dL (ref 0.2–1.2)
Total Protein: 6.4 g/dL (ref 6.1–8.1)

## 2016-05-17 ENCOUNTER — Ambulatory Visit (HOSPITAL_COMMUNITY): Payer: Self-pay

## 2016-06-01 ENCOUNTER — Ambulatory Visit: Payer: Self-pay | Admitting: Physician Assistant

## 2016-06-08 ENCOUNTER — Ambulatory Visit: Payer: Self-pay | Admitting: Physician Assistant

## 2016-06-14 ENCOUNTER — Ambulatory Visit (HOSPITAL_COMMUNITY)
Admission: RE | Admit: 2016-06-14 | Discharge: 2016-06-14 | Disposition: A | Discharge status: Home / Self Care | Payer: Self-pay | Source: Ambulatory Visit | Attending: Physician Assistant | Admitting: Physician Assistant

## 2016-06-14 DIAGNOSIS — M5136 Other intervertebral disc degeneration, lumbar region: Secondary | ICD-10-CM | POA: Insufficient documentation

## 2016-06-14 DIAGNOSIS — M48061 Spinal stenosis, lumbar region without neurogenic claudication: Secondary | ICD-10-CM | POA: Insufficient documentation

## 2016-06-14 DIAGNOSIS — M545 Low back pain: Secondary | ICD-10-CM | POA: Insufficient documentation

## 2016-06-14 DIAGNOSIS — M4316 Spondylolisthesis, lumbar region: Secondary | ICD-10-CM | POA: Insufficient documentation

## 2016-06-22 ENCOUNTER — Encounter: Payer: Self-pay | Admitting: Physician Assistant

## 2016-06-22 ENCOUNTER — Ambulatory Visit: Payer: Self-pay | Admitting: Physician Assistant

## 2016-06-22 VITALS — BP 120/68 | HR 93 | Temp 97.9°F | Ht 64.25 in | Wt 254.5 lb

## 2016-06-22 DIAGNOSIS — M545 Low back pain, unspecified: Secondary | ICD-10-CM

## 2016-06-22 DIAGNOSIS — R937 Abnormal findings on diagnostic imaging of other parts of musculoskeletal system: Secondary | ICD-10-CM

## 2016-06-22 MED ORDER — CITALOPRAM HYDROBROMIDE 20 MG PO TABS: 20.0000 mg | ORAL_TABLET | Freq: Every day | ORAL | 3 refills | Status: DC

## 2016-06-22 NOTE — Progress Notes (Signed)
BP 120/68 (BP Location: Left Arm, Patient Position: Sitting, Cuff Size: Large)   Pulse 93   Temp 97.9 F (36.6 C) (Other (Comment))   Ht 5' 4.25" (1.632 m)   Wt 254 lb 8 oz (115.4 kg)   SpO2 96%   BMI 43.35 kg/m    Subjective:    Patient ID: Lori Calderon, female    DOB: December 08, 1965, 51 y.o.   MRN: [id number]  HPI: Lori Calderon is a 51 y.o. female presenting on 06/22/2016 for No chief complaint on file.   HPI   Pt still working at [employer]  She turned in [facility] discount application   She got mri done. We reviewed results.  She is still having lots of pain.  Says that the diclofenac takes the edge off  Relevant past medical, surgical, family and social history reviewed and updated as indicated. Interim medical history since our last visit reviewed. Allergies and medications reviewed and updated.   Current Outpatient Prescriptions:  .  albuterol (PROVENTIL HFA;VENTOLIN HFA) 108 (90 Base) MCG/ACT inhaler, Inhale 2 puffs into the lungs every 6 (six) hours as needed for wheezing or shortness of breath., Disp: 3 Inhaler, Rfl: 1 .  atorvastatin (LIPITOR) 20 MG tablet, Take 1 tablet (20 mg total) by mouth daily., Disp: 90 tablet, Rfl: 2 .  citalopram (CELEXA) 20 MG tablet, Take 1 tablet (20 mg total) by mouth daily., Disp: 90 tablet, Rfl: 1 .  diclofenac (VOLTAREN) 75 MG EC tablet, Take 1 tablet (75 mg total) by mouth 2 (two) times daily as needed., Disp: 60 tablet, Rfl: 1   Review of Systems  Constitutional: Negative for appetite change, chills, diaphoresis, fatigue, fever and unexpected weight change.  HENT: Negative for congestion, dental problem, drooling, ear pain, facial swelling, hearing loss, mouth sores, sneezing, sore throat, trouble swallowing and voice change.   Eyes: Positive for redness. Negative for pain, discharge, itching and visual disturbance.  Respiratory: Positive for shortness of breath. Negative for cough, choking and wheezing.   Cardiovascular: Negative  for chest pain, palpitations and leg swelling.  Gastrointestinal: Negative for abdominal pain, blood in stool, constipation, diarrhea and vomiting.  Endocrine: Positive for heat intolerance and polydipsia. Negative for cold intolerance.  Genitourinary: Negative for decreased urine volume, dysuria and hematuria.  Musculoskeletal: Positive for arthralgias, back pain and gait problem.  Skin: Negative for rash.  Allergic/Immunologic: Negative for environmental allergies.  Neurological: Negative for seizures, syncope, light-headedness and headaches.  Hematological: Negative for adenopathy.  Psychiatric/Behavioral: Negative for agitation, dysphoric mood and suicidal ideas. The patient is not nervous/anxious.     Per HPI unless specifically indicated above     Objective:    BP 120/68 (BP Location: Left Arm, Patient Position: Sitting, Cuff Size: Large)   Pulse 93   Temp 97.9 F (36.6 C) (Other (Comment))   Ht 5' 4.25" (1.632 m)   Wt 254 lb 8 oz (115.4 kg)   SpO2 96%   BMI 43.35 kg/m   Wt Readings from Last 3 Encounters:  06/22/16 254 lb 8 oz (115.4 kg)  05/04/16 253 lb (114.8 kg)  03/20/16 253 lb 8 oz (115 kg)    Physical Exam  Constitutional: She is oriented to person, place, and time. She appears well-developed and well-nourished.  HENT:  Head: Normocephalic and atraumatic.  Neck: Neck supple.  Cardiovascular: Normal rate and regular rhythm.   Pulmonary/Chest: Effort normal and breath sounds normal.  Abdominal: Soft. Bowel sounds are normal. She exhibits no mass. There is no  hepatosplenomegaly. There is no tenderness.  Musculoskeletal: She exhibits no edema.  Lymphadenopathy:    She has no cervical adenopathy.  Neurological: She is alert and oriented to person, place, and time.  Skin: Skin is warm and dry.  Psychiatric: She has a normal mood and affect. Her behavior is normal.  Vitals reviewed.   Results for orders placed or performed in visit on 04/27/16  Comprehensive  metabolic panel  Result Value Ref Range   Sodium 140 135 - 146 mmol/L   Potassium 4.8 3.5 - 5.3 mmol/L   Chloride 107 98 - 110 mmol/L   CO2 27 20 - 31 mmol/L   Glucose, Bld 91 65 - 99 mg/dL   BUN 20 7 - 25 mg/dL   Creat 1.61 0.96 - 0.45 mg/dL   Total Bilirubin 0.6 0.2 - 1.2 mg/dL   Alkaline Phosphatase 74 33 - 130 U/L   AST 24 10 - 35 U/L   ALT 27 6 - 29 U/L   Total Protein 6.4 6.1 - 8.1 g/dL   Albumin 3.8 3.6 - 5.1 g/dL   Calcium 8.9 8.6 - 40.9 mg/dL  Lipid panel  Result Value Ref Range   Cholesterol 121 <200 mg/dL   Triglycerides 811 <914 mg/dL   HDL 31 (L) >78 mg/dL   Total CHOL/HDL Ratio 3.9 <5.0 Ratio   VLDL 22 <30 mg/dL   LDL Cholesterol 68 <295 mg/dL      Assessment & Plan:   Encounter Diagnoses  Name Primary?  . Low back pain without sciatica, unspecified back pain laterality, unspecified chronicity Yes  . Abnormal MRI, lumbar spine   . Obesity, morbid (HCC)     -reviewed labs with pt -reviewed mri with pt -will Refer to orthopedics for back pain -Recommended exercise/walking for weight loss which will help her back pain -Recommended she continue the back exercises that were given at previous OV -follow up 6 weeks to make sure she has orthopedics appointment.  RTO sooner prn

## 2016-06-22 NOTE — Patient Instructions (Signed)
[facility] customer support [phone]

## 2016-07-28 ENCOUNTER — Other Ambulatory Visit (HOSPITAL_COMMUNITY)
Admission: RE | Admit: 2016-07-28 | Discharge: 2016-07-28 | Disposition: A | Discharge status: Home / Self Care | Payer: Self-pay | Source: Ambulatory Visit | Attending: Physician Assistant | Admitting: Physician Assistant

## 2016-07-28 LAB — COMPREHENSIVE METABOLIC PANEL
ALT: 45 U/L (ref 14–54)
AST: 38 U/L (ref 15–41)
Albumin: 4 g/dL (ref 3.5–5.0)
Alkaline Phosphatase: 74 U/L (ref 38–126)
Anion gap: 7 (ref 5–15)
BUN: 13 mg/dL (ref 6–20)
CO2: 28 mmol/L (ref 22–32)
Calcium: 8.9 mg/dL (ref 8.9–10.3)
Chloride: 103 mmol/L (ref 101–111)
Creatinine, Ser: 0.87 mg/dL (ref 0.44–1.00)
GFR calc Af Amer: >60 mL/min (ref >60)
GFR calc non Af Amer: >60 mL/min (ref >60)
Glucose, Bld: 98 mg/dL (ref 65–99)
Potassium: 4.4 mmol/L (ref 3.5–5.1)
Sodium: 138 mmol/L (ref 135–145)
Total Bilirubin: 0.7 mg/dL (ref 0.3–1.2)
Total Protein: 7.2 g/dL (ref 6.5–8.1)

## 2016-07-28 LAB — LIPID PANEL
Cholesterol: 171 mg/dL (ref 0–200)
HDL: 32 mg/dL — ABNORMAL LOW (ref >40)
LDL Cholesterol: 110 mg/dL — ABNORMAL HIGH (ref 0–99)
Total CHOL/HDL Ratio: 5.3 RATIO
Triglycerides: 145 mg/dL (ref <150)
VLDL: 29 mg/dL (ref 0–40)

## 2016-08-03 ENCOUNTER — Encounter: Payer: Self-pay | Admitting: Physician Assistant

## 2016-08-03 ENCOUNTER — Ambulatory Visit: Payer: Self-pay | Admitting: Physician Assistant

## 2016-08-03 VITALS — BP 120/70 | HR 96 | Temp 97.9°F | Ht 64.25 in | Wt 252.8 lb

## 2016-08-03 DIAGNOSIS — F17219 Nicotine dependence, cigarettes, with unspecified nicotine-induced disorders: Secondary | ICD-10-CM

## 2016-08-03 DIAGNOSIS — E785 Hyperlipidemia, unspecified: Secondary | ICD-10-CM

## 2016-08-03 DIAGNOSIS — J449 Chronic obstructive pulmonary disease, unspecified: Secondary | ICD-10-CM

## 2016-08-03 DIAGNOSIS — M545 Low back pain, unspecified: Secondary | ICD-10-CM

## 2016-08-03 DIAGNOSIS — R937 Abnormal findings on diagnostic imaging of other parts of musculoskeletal system: Secondary | ICD-10-CM

## 2016-08-03 NOTE — Progress Notes (Signed)
BP 120/70 (BP Location: Left Arm, Patient Position: Sitting, Cuff Size: Normal)   Pulse 96   Temp 97.9 F (36.6 C)   Ht 5' 4.25" (1.632 m)   Wt 252 lb 12 oz (114.6 kg)   SpO2 98%   BMI 43.05 kg/m    Subjective:    Patient ID: Lori Calderon, female    DOB: [birth year], 51 y.o.   MRN: [id number]  HPI: Lori Calderon is a 51 y.o. female presenting on 08/03/2016 for Hyperlipidemia   HPI  Pt not heard on [facility] discount application or orthopedics appt  Her back still hurts  Relevant past medical, surgical, family and social history reviewed and updated as indicated. Interim medical history since our last visit reviewed. Allergies and medications reviewed and updated.   Current Outpatient Prescriptions:  .  albuterol (PROVENTIL HFA;VENTOLIN HFA) 108 (90 Base) MCG/ACT inhaler, Inhale 2 puffs into the lungs every 6 (six) hours as needed for wheezing or shortness of breath., Disp: 3 Inhaler, Rfl: 1 .  atorvastatin (LIPITOR) 20 MG tablet, Take 1 tablet (20 mg total) by mouth daily., Disp: 90 tablet, Rfl: 2 .  citalopram (CELEXA) 20 MG tablet, Take 1 tablet (20 mg total) by mouth daily., Disp: 30 tablet, Rfl: 3 .  diclofenac (VOLTAREN) 75 MG EC tablet, Take 1 tablet (75 mg total) by mouth 2 (two) times daily as needed., Disp: 60 tablet, Rfl: 1   Review of Systems  Constitutional: Positive for fatigue. Negative for appetite change, chills, diaphoresis, fever and unexpected weight change.  HENT: Positive for congestion. Negative for dental problem, drooling, ear pain, facial swelling, hearing loss, mouth sores, sneezing, sore throat, trouble swallowing and voice change.   Eyes: Negative for pain, discharge, redness, itching and visual disturbance.  Respiratory: Positive for cough and shortness of breath. Negative for choking and wheezing.   Cardiovascular: Negative for chest pain, palpitations and leg swelling.  Gastrointestinal: Negative for abdominal pain, blood in stool,  constipation, diarrhea and vomiting.  Endocrine: Negative for cold intolerance, heat intolerance and polydipsia.  Genitourinary: Negative for decreased urine volume, dysuria and hematuria.  Musculoskeletal: Positive for arthralgias, back pain and gait problem.  Skin: Negative for rash.  Allergic/Immunologic: Negative for environmental allergies.  Neurological: Positive for headaches. Negative for seizures, syncope and light-headedness.  Hematological: Negative for adenopathy.  Psychiatric/Behavioral: Negative for agitation, dysphoric mood and suicidal ideas. The patient is not nervous/anxious.     Per HPI unless specifically indicated above     Objective:    BP 120/70 (BP Location: Left Arm, Patient Position: Sitting, Cuff Size: Normal)   Pulse 96   Temp 97.9 F (36.6 C)   Ht 5' 4.25" (1.632 m)   Wt 252 lb 12 oz (114.6 kg)   SpO2 98%   BMI 43.05 kg/m   Wt Readings from Last 3 Encounters:  08/03/16 252 lb 12 oz (114.6 kg)  06/22/16 254 lb 8 oz (115.4 kg)  05/04/16 253 lb (114.8 kg)    Physical Exam  Constitutional: She is oriented to person, place, and time. She appears well-developed and well-nourished.  HENT:  Head: Normocephalic and atraumatic.  Mouth/Throat: Oropharynx is clear and moist. No oropharyngeal exudate.  Eyes: Conjunctivae and EOM are normal. Pupils are equal, round, and reactive to light.  Neck: Neck supple. No thyromegaly present.  Cardiovascular: Normal rate and regular rhythm.   Pulmonary/Chest: Effort normal and breath sounds normal.  Abdominal: Soft. Bowel sounds are normal. She exhibits no mass. There is no hepatosplenomegaly.  There is no tenderness.  Musculoskeletal: She exhibits no edema.  Lymphadenopathy:    She has no cervical adenopathy.  Neurological: She is alert and oriented to person, place, and time. Gait normal.  Skin: Skin is warm and dry.  Psychiatric: She has a normal mood and affect. Her behavior is normal.  Vitals  reviewed.   Results for orders placed or performed during the hospital encounter of 07/28/16  Comprehensive metabolic panel  Result Value Ref Range   Sodium 138 135 - 145 mmol/L   Potassium 4.4 3.5 - 5.1 mmol/L   Chloride 103 101 - 111 mmol/L   CO2 28 22 - 32 mmol/L   Glucose, Bld 98 65 - 99 mg/dL   BUN 13 6 - 20 mg/dL   Creatinine, Ser 8.650.87 0.44 - 1.00 mg/dL   Calcium 8.9 8.9 - 78.410.3 mg/dL   Total Protein 7.2 6.5 - 8.1 g/dL   Albumin 4.0 3.5 - 5.0 g/dL   AST 38 15 - 41 U/L   ALT 45 14 - 54 U/L   Alkaline Phosphatase 74 38 - 126 U/L   Total Bilirubin 0.7 0.3 - 1.2 mg/dL   GFR calc non Af Amer >60 >60 mL/min   GFR calc Af Amer >60 >60 mL/min   Anion gap 7 5 - 15  Lipid panel  Result Value Ref Range   Cholesterol 171 0 - 200 mg/dL   Triglycerides 696145 <295<150 mg/dL   HDL 32 (L) >28>40 mg/dL   Total CHOL/HDL Ratio 5.3 RATIO   VLDL 29 0 - 40 mg/dL   LDL Cholesterol 413110 (H) 0 - 99 mg/dL      Assessment & Plan:   Encounter Diagnoses  Name Primary?  . Low back pain without sciatica, unspecified back pain laterality, unspecified chronicity Yes  . Hyperlipidemia, unspecified hyperlipidemia type   . Cigarette nicotine dependence with nicotine-induced disorder   . Chronic obstructive pulmonary disease, unspecified COPD type (HCC)   . Abnormal MRI, lumbar spine   . Obesity, morbid (HCC)     -reviewed labs with pt -pt counseled to Watch low-fat diet -check on referral to ortho -pt to contact our office in 2 weeks if she hasn't heard from orthopedics -counseled smoking cessation -pt to follow up 3 months. RTO sooner prn

## 2016-08-03 NOTE — Patient Instructions (Signed)
Fat and Cholesterol Restricted Diet High levels of fat and cholesterol in your blood may lead to various health problems, such as diseases of the heart, blood vessels, gallbladder, liver, and pancreas. Fats are concentrated sources of energy that come in various forms. Certain types of fat, including saturated fat, may be harmful in excess. Cholesterol is a substance needed by your body in small amounts. Your body makes all the cholesterol it needs. Excess cholesterol comes from the food you eat. When you have high levels of cholesterol and saturated fat in your blood, health problems can develop because the excess fat and cholesterol will gather along the walls of your blood vessels, causing them to narrow. Choosing the right foods will help you control your intake of fat and cholesterol. This will help keep the levels of these substances in your blood within normal limits and reduce your risk of disease. What is my plan? Your health care provider recommends that you:  Limit your fat intake to ______% or less of your total calories per day.  Limit the amount of cholesterol in your diet to less than _________mg per day.  Eat 20-30 grams of fiber each day.  What types of fat should I choose?  Choose healthy fats more often. Choose monounsaturated and polyunsaturated fats, such as olive and canola oil, flaxseeds, walnuts, almonds, and seeds.  Eat more omega-3 fats. Good choices include salmon, mackerel, sardines, tuna, flaxseed oil, and ground flaxseeds. Aim to eat fish at least two times a week.  Limit saturated fats. Saturated fats are primarily found in animal products, such as meats, butter, and cream. Plant sources of saturated fats include palm oil, palm kernel oil, and coconut oil.  Avoid foods with partially hydrogenated oils in them. These contain trans fats. Examples of foods that contain trans fats are stick margarine, some tub margarines, cookies, crackers, and other baked goods. What  general guidelines do I need to follow? These guidelines for healthy eating will help you control your intake of fat and cholesterol:  Check food labels carefully to identify foods with trans fats or high amounts of saturated fat.  Fill one half of your plate with vegetables and green salads.  Fill one fourth of your plate with whole grains. Look for the word "whole" as the first word in the ingredient list.  Fill one fourth of your plate with lean protein foods.  Limit fruit to two servings a day. Choose fruit instead of juice.  Eat more foods that contain fiber, such as apples, broccoli, carrots, beans, peas, and barley.  Eat more home-cooked food and less restaurant, buffet, and fast food.  Limit or avoid alcohol.  Limit foods high in starch and sugar.  Limit fried foods.  Cook foods using methods other than frying. Baking, boiling, grilling, and broiling are all great options.  Lose weight if you are overweight. Losing just 5-10% of your initial body weight can help your overall health and prevent diseases such as diabetes and heart disease.  What foods can I eat? Grains  Whole grains, such as whole wheat or whole grain breads, crackers, cereals, and pasta. Unsweetened oatmeal, bulgur, barley, quinoa, or brown rice. Corn or whole wheat flour tortillas. Vegetables  Fresh or frozen vegetables (raw, steamed, roasted, or grilled). Green salads. Fruits  All fresh, canned (in natural juice), or frozen fruits. Meats and other protein foods  Ground beef (85% or leaner), grass-fed beef, or beef trimmed of fat. Skinless chicken or turkey. Ground chicken or turkey.   Pork trimmed of fat. All fish and seafood. Eggs. Dried beans, peas, or lentils. Unsalted nuts or seeds. Unsalted canned or dry beans. Dairy  Low-fat dairy products, such as skim or 1% milk, 2% or reduced-fat cheeses, low-fat ricotta or cottage cheese, or plain low-fat yo Fats and oils  Tub margarines without trans  fats. Light or reduced-fat mayonnaise and salad dressings. Avocado. Olive, canola, sesame, or safflower oils. Natural peanut or almond butter (choose ones without added sugar and oil). The items listed above may not be a complete list of recommended foods or beverages. Contact your dietitian for more options. Foods to avoid Grains  White bread. White pasta. White rice. Cornbread. Bagels, pastries, and croissants. Crackers that contain trans fat. Vegetables  White potatoes. Corn. Creamed or fried vegetables. Vegetables in a cheese sauce. Fruits  Dried fruits. Canned fruit in light or heavy syrup. Fruit juice. Meats and other protein foods  Fatty cuts of meat. Ribs, chicken wings, bacon, sausage, bologna, salami, chitterlings, fatback, hot dogs, bratwurst, and packaged luncheon meats. Liver and organ meats. Dairy  Whole or 2% milk, cream, half-and-half, and cream cheese. Whole milk cheeses. Whole-fat or sweetened yogurt. Full-fat cheeses. Nondairy creamers and whipped toppings. Processed cheese, cheese spreads, or cheese curds. Beverages  Alcohol. Sweetened drinks (such as sodas, lemonade, and fruit drinks or punches). Fats and oils  Butter, stick margarine, lard, shortening, ghee, or bacon fat. Coconut, palm kernel, or palm oils. Sweets and desserts  Corn syrup, sugars, honey, and molasses. Candy. Jam and jelly. Syrup. Sweetened cereals. Cookies, pies, cakes, donuts, muffins, and ice cream. The items listed above may not be a complete list of foods and beverages to avoid. Contact your dietitian for more information. This information is not intended to replace advice given to you by your health care provider. Make sure you discuss any questions you have with your health care provider. Document Released: 02/27/2005 Document Revised: 03/20/2014 Document Reviewed: 05/28/2013 Elsevier Interactive Patient Education  2017 [employer] Inc[employer]  

## 2016-09-05 ENCOUNTER — Other Ambulatory Visit: Payer: Self-pay | Admitting: Physician Assistant

## 2016-09-05 DIAGNOSIS — E785 Hyperlipidemia, unspecified: Secondary | ICD-10-CM

## 2016-10-25 ENCOUNTER — Other Ambulatory Visit (HOSPITAL_COMMUNITY)
Admission: RE | Admit: 2016-10-25 | Discharge: 2016-10-25 | Disposition: A | Discharge status: Home / Self Care | Payer: Self-pay | Source: Ambulatory Visit | Attending: Physician Assistant | Admitting: Physician Assistant

## 2016-10-25 DIAGNOSIS — E785 Hyperlipidemia, unspecified: Secondary | ICD-10-CM | POA: Insufficient documentation

## 2016-10-25 LAB — COMPREHENSIVE METABOLIC PANEL
ALT: 40 U/L (ref 14–54)
AST: 31 U/L (ref 15–41)
Albumin: 3.9 g/dL (ref 3.5–5.0)
Alkaline Phosphatase: 74 U/L (ref 38–126)
Anion gap: 8 (ref 5–15)
BUN: 16 mg/dL (ref 6–20)
CO2: 25 mmol/L (ref 22–32)
Calcium: 9.2 mg/dL (ref 8.9–10.3)
Chloride: 104 mmol/L (ref 101–111)
Creatinine, Ser: 0.85 mg/dL (ref 0.44–1.00)
GFR calc Af Amer: >60 mL/min (ref >60)
GFR calc non Af Amer: >60 mL/min (ref >60)
Glucose, Bld: 95 mg/dL (ref 65–99)
Potassium: 4.3 mmol/L (ref 3.5–5.1)
Sodium: 137 mmol/L (ref 135–145)
Total Bilirubin: 0.7 mg/dL (ref 0.3–1.2)
Total Protein: 6.9 g/dL (ref 6.5–8.1)

## 2016-10-25 LAB — LIPID PANEL
Cholesterol: 190 mg/dL (ref 0–200)
HDL: 32 mg/dL — ABNORMAL LOW (ref >40)
LDL Cholesterol: 127 mg/dL — ABNORMAL HIGH (ref 0–99)
Total CHOL/HDL Ratio: 5.9 RATIO
Triglycerides: 157 mg/dL — ABNORMAL HIGH (ref <150)
VLDL: 31 mg/dL (ref 0–40)

## 2016-11-01 ENCOUNTER — Ambulatory Visit: Payer: Self-pay | Admitting: Physician Assistant

## 2016-11-01 ENCOUNTER — Encounter: Payer: Self-pay | Admitting: Physician Assistant

## 2016-11-01 VITALS — BP 126/70 | HR 89 | Temp 97.9°F | Ht 64.25 in | Wt 245.5 lb

## 2016-11-01 DIAGNOSIS — F329 Major depressive disorder, single episode, unspecified: Secondary | ICD-10-CM

## 2016-11-01 DIAGNOSIS — M545 Low back pain, unspecified: Secondary | ICD-10-CM

## 2016-11-01 DIAGNOSIS — R937 Abnormal findings on diagnostic imaging of other parts of musculoskeletal system: Secondary | ICD-10-CM

## 2016-11-01 DIAGNOSIS — F32A Depression, unspecified: Secondary | ICD-10-CM

## 2016-11-01 DIAGNOSIS — J449 Chronic obstructive pulmonary disease, unspecified: Secondary | ICD-10-CM

## 2016-11-01 DIAGNOSIS — F17219 Nicotine dependence, cigarettes, with unspecified nicotine-induced disorders: Secondary | ICD-10-CM

## 2016-11-01 DIAGNOSIS — E785 Hyperlipidemia, unspecified: Secondary | ICD-10-CM

## 2016-11-01 MED ORDER — SULFAMETHOXAZOLE-TRIMETHOPRIM 800-160 MG PO TABS: 1.0000 | ORAL_TABLET | Freq: Two times a day (BID) | ORAL | 0 refills | Status: DC

## 2016-11-01 NOTE — Progress Notes (Signed)
BP 126/70 (BP Location: Left Arm, Patient Position: Sitting)   Pulse 89   Temp 97.9 F (36.6 C)   Ht 5' 4.25" (1.632 m)   Wt 245 lb 8 oz (111.4 kg)   SpO2 96%   BMI 41.81 kg/m    Subjective:    Patient ID: Lori Calderon, female    DOB: 1965/06/06, 51 y.o.   MRN: [id number]  HPI: Lori Calderon is a 51 y.o. female presenting on 11/01/2016 for Hyperlipidemia   HPI   Pt didn't schedule with orthopedics yet because she hasn't heard on her [facility] application yet.  Pt had MRI in April so she had a bill so should have no problem getting approved  Pt has no other issues  Relevant past medical, surgical, family and social history reviewed and updated as indicated. Interim medical history since our last visit reviewed. Allergies and medications reviewed and updated.   Current Outpatient Prescriptions:  .  albuterol (PROVENTIL HFA;VENTOLIN HFA) 108 (90 Base) MCG/ACT inhaler, Inhale 2 puffs into the lungs every 6 (six) hours as needed for wheezing or shortness of breath., Disp: 3 Inhaler, Rfl: 1 .  atorvastatin (LIPITOR) 20 MG tablet, Take 1 tablet (20 mg total) by mouth daily., Disp: 90 tablet, Rfl: 2 .  citalopram (CELEXA) 20 MG tablet, Take 1 tablet (20 mg total) by mouth daily., Disp: 30 tablet, Rfl: 3 .  diclofenac (VOLTAREN) 75 MG EC tablet, Take 1 tablet (75 mg total) by mouth 2 (two) times daily as needed., Disp: 60 tablet, Rfl: 1   Review of Systems  Constitutional: Positive for diaphoresis and fatigue. Negative for appetite change, chills, fever and unexpected weight change.  HENT: Negative for congestion, dental problem, drooling, ear pain, facial swelling, hearing loss, mouth sores, sneezing, sore throat, trouble swallowing and voice change.   Eyes: Positive for pain and redness. Negative for discharge, itching and visual disturbance.  Respiratory: Positive for cough and shortness of breath. Negative for choking and wheezing.   Cardiovascular: Negative for chest  pain, palpitations and leg swelling.  Gastrointestinal: Negative for abdominal pain, blood in stool, constipation, diarrhea and vomiting.  Endocrine: Positive for heat intolerance and polydipsia. Negative for cold intolerance.  Genitourinary: Negative for decreased urine volume, dysuria and hematuria.  Musculoskeletal: Positive for arthralgias, back pain and gait problem.  Skin: Negative for rash.  Allergic/Immunologic: Negative for environmental allergies.  Neurological: Positive for headaches. Negative for seizures, syncope and light-headedness.  Hematological: Negative for adenopathy.  Psychiatric/Behavioral: Negative for agitation, dysphoric mood and suicidal ideas. The patient is not nervous/anxious.     Per HPI unless specifically indicated above     Objective:    BP 126/70 (BP Location: Left Arm, Patient Position: Sitting)   Pulse 89   Temp 97.9 F (36.6 C)   Ht 5' 4.25" (1.632 m)   Wt 245 lb 8 oz (111.4 kg)   SpO2 96%   BMI 41.81 kg/m   Wt Readings from Last 3 Encounters:  11/01/16 245 lb 8 oz (111.4 kg)  08/03/16 252 lb 12 oz (114.6 kg)  06/22/16 254 lb 8 oz (115.4 kg)    Physical Exam  Constitutional: She is oriented to person, place, and time. She appears well-developed and well-nourished.  HENT:  Head: Normocephalic and atraumatic.  Neck: Neck supple.  Cardiovascular: Normal rate and regular rhythm.   Pulmonary/Chest: Effort normal and breath sounds normal.  Abdominal: Soft. Bowel sounds are normal. She exhibits no mass. There is no hepatosplenomegaly. There is  no tenderness.  Musculoskeletal: She exhibits no edema.  Lymphadenopathy:    She has no cervical adenopathy.  Neurological: She is alert and oriented to person, place, and time.  Skin: Skin is warm and dry.  Psychiatric: She has a normal mood and affect. Her behavior is normal.  Vitals reviewed.   Results for orders placed or performed during the hospital encounter of 10/25/16  Comprehensive  metabolic panel  Result Value Ref Range   Sodium 137 135 - 145 mmol/L   Potassium 4.3 3.5 - 5.1 mmol/L   Chloride 104 101 - 111 mmol/L   CO2 25 22 - 32 mmol/L   Glucose, Bld 95 65 - 99 mg/dL   BUN 16 6 - 20 mg/dL   Creatinine, Ser 1.61 0.44 - 1.00 mg/dL   Calcium 9.2 8.9 - 09.6 mg/dL   Total Protein 6.9 6.5 - 8.1 g/dL   Albumin 3.9 3.5 - 5.0 g/dL   AST 31 15 - 41 U/L   ALT 40 14 - 54 U/L   Alkaline Phosphatase 74 38 - 126 U/L   Total Bilirubin 0.7 0.3 - 1.2 mg/dL   GFR calc non Af Amer >60 >60 mL/min   GFR calc Af Amer >60 >60 mL/min   Anion gap 8 5 - 15  Lipid panel  Result Value Ref Range   Cholesterol 190 0 - 200 mg/dL   Triglycerides 045 (H) <150 mg/dL   HDL 32 (L) >40 mg/dL   Total CHOL/HDL Ratio 5.9 RATIO   VLDL 31 0 - 40 mg/dL   LDL Cholesterol 981 (H) 0 - 99 mg/dL      Assessment & Plan:    Encounter Diagnoses  Name Primary?  . Hyperlipidemia, unspecified hyperlipidemia type Yes  . Chronic obstructive pulmonary disease, unspecified COPD type (HCC)   . Cigarette nicotine dependence with nicotine-induced disorder   . Low back pain without sciatica, unspecified back pain laterality, unspecified chronicity   . Abnormal MRI, lumbar spine   . Depression, unspecified depression type   . Obesity, morbid (HCC)     -reviewed labs with pt -gave pt phone number for financial counselor and recommended she call -will order screening mammogram which is due mid-October -counseled pt on smoking cessation -pt to follow up in 3 months.  RTO sooner prn

## 2016-11-01 NOTE — Patient Instructions (Signed)
Financial counselor- [phone]   

## 2016-11-21 ENCOUNTER — Encounter: Payer: Self-pay | Admitting: Physician Assistant

## 2016-11-21 ENCOUNTER — Ambulatory Visit: Payer: Self-pay | Admitting: Physician Assistant

## 2016-11-21 VITALS — BP 124/64 | HR 89 | Temp 98.1°F | Ht 62.25 in | Wt 246.5 lb

## 2016-11-21 DIAGNOSIS — L02211 Cutaneous abscess of abdominal wall: Secondary | ICD-10-CM

## 2016-11-21 MED ORDER — SULFAMETHOXAZOLE-TRIMETHOPRIM 800-160 MG PO TABS: 1.0000 | ORAL_TABLET | Freq: Two times a day (BID) | ORAL | 0 refills | Status: DC

## 2016-11-21 NOTE — Patient Instructions (Signed)

## 2016-11-21 NOTE — Progress Notes (Signed)
BP 124/64 (BP Location: Left Arm, Patient Position: Sitting, Cuff Size: Large)   Pulse 89   Temp 98.1 F (36.7 C) (Other (Comment))   Ht 5' 2.25" (1.581 m)   Wt 246 lb 8 oz (111.8 kg)   SpO2 97%   BMI 44.72 kg/m    Subjective:    Patient ID: Lori Calderon, female    DOB: 1965-12-22, 51 y.o.   MRN: [id number]  HPI: Lori Calderon is a 51 y.o. female presenting on 11/21/2016 for Mass (c/o possible boil which is painful, to left abd, started 4 days ago)   HPI Chief Complaint  Patient presents with  . Mass    c/o possible boil which is painful, to left abd, started 4 days ago    Pt with hx abscesses.  Discussed risks and benefits of I&D and pt desires to proceed.  Informed consent form is signed by pt Relevant past medical, surgical, family and social history reviewed and updated as indicated. Interim medical history since our last visit reviewed. Allergies and medications reviewed and updated.   Current Outpatient Prescriptions:  .  albuterol (PROVENTIL HFA;VENTOLIN HFA) 108 (90 Base) MCG/ACT inhaler, Inhale 2 puffs into the lungs every 6 (six) hours as needed for wheezing or shortness of breath., Disp: 3 Inhaler, Rfl: 1 .  atorvastatin (LIPITOR) 20 MG tablet, Take 1 tablet (20 mg total) by mouth daily., Disp: 90 tablet, Rfl: 2 .  citalopram (CELEXA) 20 MG tablet, Take 1 tablet (20 mg total) by mouth daily., Disp: 30 tablet, Rfl: 3 .  diclofenac (VOLTAREN) 75 MG EC tablet, Take 1 tablet (75 mg total) by mouth 2 (two) times daily as needed., Disp: 60 tablet, Rfl: 1   Review of Systems  Constitutional: Negative for appetite change, chills, diaphoresis, fatigue, fever and unexpected weight change.  HENT: Negative for congestion, dental problem, drooling, ear pain, facial swelling, hearing loss, mouth sores, sneezing, sore throat, trouble swallowing and voice change.   Eyes: Negative for pain, discharge, redness, itching and visual disturbance.  Respiratory: Positive for  cough and shortness of breath. Negative for choking and wheezing.   Cardiovascular: Negative for chest pain, palpitations and leg swelling.  Gastrointestinal: Negative for abdominal pain, blood in stool, constipation, diarrhea and vomiting.  Endocrine: Positive for heat intolerance and polydipsia. Negative for cold intolerance.  Genitourinary: Negative for decreased urine volume, dysuria and hematuria.  Musculoskeletal: Positive for arthralgias, back pain and gait problem.  Skin: Negative for rash.  Allergic/Immunologic: Negative for environmental allergies.  Neurological: Positive for headaches. Negative for seizures, syncope and light-headedness.  Hematological: Negative for adenopathy.  Psychiatric/Behavioral: Negative for agitation, dysphoric mood and suicidal ideas. The patient is not nervous/anxious.     Per HPI unless specifically indicated above     Objective:    BP 124/64 (BP Location: Left Arm, Patient Position: Sitting, Cuff Size: Large)   Pulse 89   Temp 98.1 F (36.7 C) (Other (Comment))   Ht 5' 2.25" (1.581 m)   Wt 246 lb 8 oz (111.8 kg)   SpO2 97%   BMI 44.72 kg/m   Wt Readings from Last 3 Encounters:  11/21/16 246 lb 8 oz (111.8 kg)  11/01/16 245 lb 8 oz (111.4 kg)  08/03/16 252 lb 12 oz (114.6 kg)    Physical Exam  Constitutional: She is oriented to person, place, and time. She appears well-developed and well-nourished.  HENT:  Head: Normocephalic and atraumatic.  Pulmonary/Chest: Effort normal.  Abdominal: Soft. Bowel sounds are normal.  There is no tenderness.  Neurological: She is alert and oriented to person, place, and time.  Skin: Skin is warm and dry.     Abscess approx 2 inch diameter on L upper abdomen which is red, tender and fluctuant.  The area is cleaned with hibiclens.  Instill 1.5cc  Of lidocaine 1% with epi.  The abscess was opened with #11 blade and copious pus expressed.  Sterile 1/4 inch packing was placed in abscess.  Dry sterile dressing  applied.  Blood loss minimal.   Pt tolerated procedure well and ambulated from room without assistance.   Psychiatric: She has a normal mood and affect. Her behavior is normal.  Nursing note and vitals reviewed.       Assessment & Plan:   Encounter Diagnosis  Name Primary?  . Cutaneous abscess of abdominal wall Yes    -rx septra ds -warm compresses -pt to remove packing 24-48 hours -pt to follow up as scheduled.  She is to RTO for any problems with the abscess or if it fails to resolve

## 2016-12-27 ENCOUNTER — Ambulatory Visit: Payer: Self-pay | Admitting: Physician Assistant

## 2016-12-27 VITALS — BP 120/70 | HR 86 | Temp 97.9°F | Ht 62.25 in | Wt 247.5 lb

## 2016-12-27 DIAGNOSIS — L304 Erythema intertrigo: Secondary | ICD-10-CM

## 2016-12-27 NOTE — Progress Notes (Signed)
   BP 120/70 (BP Location: Left Arm, Patient Position: Sitting, Cuff Size: Normal)   Pulse 86   Temp 97.9 F (36.6 C)   Ht 5' 2.25" (1.581 m)   Wt 247 lb 8 oz (112.3 kg)   SpO2 95%   BMI 44.91 kg/m    Subjective:    Patient ID: Lori Calderon, female    DOB: [birth year], 51 y.o.   MRN: [id number]  HPI: Lori PinesMechelle R Caggiano is a 51 y.o. female presenting on 12/27/2016 for Rash (under bilateral breast. pt states off and on for 3-4 years. pt states it started back up in 2 weeks. pt states itchy)   HPI  Chief Complaint  Patient presents with  . Rash    under bilateral breast. pt states off and on for 3-4 years. pt states it started back up in 2 weeks. pt states itchy    Relevant past medical, surgical, family and social history reviewed and updated as indicated. Interim medical history since our last visit reviewed. Allergies and medications reviewed and updated.  Review of Systems  Constitutional: Positive for fatigue. Negative for appetite change, chills, diaphoresis, fever and unexpected weight change.  HENT: Negative for congestion, dental problem, drooling, ear pain, facial swelling, hearing loss, mouth sores, sneezing, sore throat, trouble swallowing and voice change.   Eyes: Positive for itching. Negative for pain, discharge, redness and visual disturbance.  Respiratory: Positive for shortness of breath. Negative for cough, choking and wheezing.   Cardiovascular: Negative for chest pain, palpitations and leg swelling.  Gastrointestinal: Negative for abdominal pain, blood in stool, constipation, diarrhea and vomiting.  Endocrine: Positive for polydipsia. Negative for cold intolerance and heat intolerance.  Genitourinary: Negative for decreased urine volume, dysuria and hematuria.  Musculoskeletal: Positive for arthralgias, back pain and gait problem.  Skin: Positive for rash.  Allergic/Immunologic: Negative for environmental allergies.  Neurological: Positive for headaches.  Negative for seizures, syncope and light-headedness.  Hematological: Negative for adenopathy.  Psychiatric/Behavioral: Negative for agitation, dysphoric mood and suicidal ideas. The patient is nervous/anxious.     Per HPI unless specifically indicated above     Objective:    BP 120/70 (BP Location: Left Arm, Patient Position: Sitting, Cuff Size: Normal)   Pulse 86   Temp 97.9 F (36.6 C)   Ht 5' 2.25" (1.581 m)   Wt 247 lb 8 oz (112.3 kg)   SpO2 95%   BMI 44.91 kg/m   Wt Readings from Last 3 Encounters:  12/27/16 247 lb 8 oz (112.3 kg)  11/21/16 246 lb 8 oz (111.8 kg)  11/01/16 245 lb 8 oz (111.4 kg)    Physical Exam  Constitutional: She is oriented to person, place, and time. She appears well-developed and well-nourished.  HENT:  Head: Normocephalic and atraumatic.  Pulmonary/Chest: Breath sounds normal.  Neurological: She is alert and oriented to person, place, and time.  Skin: Skin is warm. Rash noted.  Red angry skin under B breasts.  + sweaty area.  Psychiatric: She has a normal mood and affect. Her behavior is normal.  Nursing note and vitals reviewed.       Assessment & Plan:   Encounter Diagnosis  Name Primary?  Celesta Aver. Intertrigo Yes     OTC miconazole Counseled pt and gave reading information F/u next month as scheduled

## 2016-12-27 NOTE — Patient Instructions (Signed)
Intertrigo Intertrigo is skin irritation or inflammation (dermatitis) that occurs when folds of skin rub together. The irritation can cause a rash and make skin raw and itchy. This condition most commonly occurs in the skin folds of these areas:  Toes.  Armpits.  Groin.  Belly.  Breasts.  Buttocks.  Intertrigo is not passed from person to person (is not contagious). What are the causes? This condition is caused by heat, moisture, friction, and lack of air circulation. The condition can be made worse by:  Sweat.  Bacteria or a fungus, such as yeast.  What increases the risk? This condition is more likely to occur if you have moisture in your skin folds. It is also more likely to develop in people who:  Have diabetes.  Are overweight.  Are on bed rest.  Live in a warm and moist climate.  Wear splints, braces, or other medical devices.  Are not able to control their bowels or bladder (have incontinence).  What are the signs or symptoms? Symptoms of this condition include:  A pink or red skin rash.  Brown patches on the skin.  Raw or scaly skin.  Itchiness.  A burning feeling.  Bleeding.  Leaking fluid.  A bad smell.  How is this diagnosed? This condition is diagnosed with a medical history and physical exam. You may also have a skin swab to test for bacteria or a fungus, such as yeast. How is this treated? Treatment may include:  Cleaning and drying your skin.  An oral antibiotic medicine or antibiotic skin cream for a bacterial infection.  Antifungal cream or pills for an infection that was caused by a fungus, such as yeast.  Steroid ointment to relieve itchiness and irritation.  Follow these instructions at home:  Keep the affected area clean and dry.  Do not scratch your skin.  Stay in a cool environment as much as possible. Use an air conditioner or fan, if available.  Apply over-the-counter and prescription medicines only as told by your  health care provider.  If you were prescribed an antibiotic medicine, use it as told by your health care provider. Do not stop using the antibiotic even if your condition improves.  Keep all follow-up visits as told by your health care provider. This is important. How is this prevented?  Maintain a healthy weight.  Take care of your feet, especially if you have diabetes. Foot care includes: ? Wearing shoes that fit well. ? Keeping your feet dry. ? Wearing clean, breathable socks.  Protect the skin around your groin and buttocks, especially if you have incontinence. Skin protection includes: ? Following a regular cleaning routine. ? Using moisturizers and skin protectants. ? Changing protection pads frequently.  Do not wear tight clothes. Wear clothes that are loose and absorbent. Wear clothes that are made of cotton.  Wear a bra that gives good support, if needed.  Shower and dry yourself thoroughly after activity. Use a hair dryer on a cool setting to dry between skin folds, especially after you bathe.  If you have diabetes, keep your blood sugar under control. Contact a health care provider if:  Your symptoms do not improve with treatment.  Your symptoms get worse or they spread.  You notice increased redness and warmth.  You have a fever. This information is not intended to replace advice given to you by your health care provider. Make sure you discuss any questions you have with your health care provider. Document Released: 02/27/2005 Document Revised: 08/05/2015   Document Reviewed: 08/31/2014 Elsevier Interactive Patient Education  2018 [employer]  

## 2017-01-09 ENCOUNTER — Other Ambulatory Visit: Payer: Self-pay | Admitting: Physician Assistant

## 2017-01-09 DIAGNOSIS — Z1231 Encounter for screening mammogram for malignant neoplasm of breast: Secondary | ICD-10-CM

## 2017-02-07 ENCOUNTER — Ambulatory Visit: Payer: Self-pay | Admitting: Physician Assistant

## 2017-05-15 ENCOUNTER — Ambulatory Visit: Payer: Self-pay | Admitting: Physician Assistant

## 2017-05-15 ENCOUNTER — Encounter: Payer: Self-pay | Admitting: Physician Assistant

## 2017-05-15 VITALS — BP 126/72 | HR 88 | Temp 97.2°F | Ht 64.25 in | Wt 252.5 lb

## 2017-05-15 DIAGNOSIS — F17219 Nicotine dependence, cigarettes, with unspecified nicotine-induced disorders: Secondary | ICD-10-CM

## 2017-05-15 DIAGNOSIS — F419 Anxiety disorder, unspecified: Secondary | ICD-10-CM

## 2017-05-15 DIAGNOSIS — F329 Major depressive disorder, single episode, unspecified: Secondary | ICD-10-CM

## 2017-05-15 DIAGNOSIS — R937 Abnormal findings on diagnostic imaging of other parts of musculoskeletal system: Secondary | ICD-10-CM

## 2017-05-15 DIAGNOSIS — M545 Low back pain, unspecified: Secondary | ICD-10-CM

## 2017-05-15 DIAGNOSIS — Z1211 Encounter for screening for malignant neoplasm of colon: Secondary | ICD-10-CM

## 2017-05-15 DIAGNOSIS — E785 Hyperlipidemia, unspecified: Secondary | ICD-10-CM

## 2017-05-15 DIAGNOSIS — Z1239 Encounter for other screening for malignant neoplasm of breast: Secondary | ICD-10-CM

## 2017-05-15 DIAGNOSIS — F32A Depression, unspecified: Secondary | ICD-10-CM

## 2017-05-15 DIAGNOSIS — J449 Chronic obstructive pulmonary disease, unspecified: Secondary | ICD-10-CM

## 2017-05-15 DIAGNOSIS — G5603 Carpal tunnel syndrome, bilateral upper limbs: Secondary | ICD-10-CM

## 2017-05-15 NOTE — Progress Notes (Signed)
BP 126/72 (BP Location: Right Arm, Patient Position: Sitting, Cuff Size: Large)   Pulse 88   Temp (!) 97.2 F (36.2 C) (Other (Comment))   Ht 5' 4.25" (1.632 m)   Wt 252 lb 8 oz (114.5 kg)   SpO2 98%   BMI 43.00 kg/m    Subjective:    Patient ID: Lori Calderon, female    DOB: 11/16/1965, 52 y.o.   MRN: [id number]  HPI: Lori Calderon is a 52 y.o. female presenting on 05/15/2017 for Follow-up   HPI  Pt got voicemail about mammogram but she didn't call them back  Pt never made it to the orthopedist.  She says she doeshn't remember whether she got charity care or not .  She had been referred for her back.   She also wants to talk about her carpal tunnel.  She works at [employer] and it bothers her.  She thinks it is worse.  She has it bilaterally.  She has had it for several years.  She was recommended to have surgery but she couldn't afford it. She wears her splints every night.   She still smokes  Her mother is with her today  Relevant past medical, surgical, family and social history reviewed and updated as indicated. Interim medical history since our last visit reviewed. Allergies and medications reviewed and updated.    Current Outpatient Medications:  .  albuterol (PROVENTIL HFA;VENTOLIN HFA) 108 (90 Base) MCG/ACT inhaler, Inhale 2 puffs into the lungs every 6 (six) hours as needed for wheezing or shortness of breath., Disp: 3 Inhaler, Rfl: 1 .  atorvastatin (LIPITOR) 20 MG tablet, Take 1 tablet (20 mg total) by mouth daily., Disp: 90 tablet, Rfl: 2 .  citalopram (CELEXA) 20 MG tablet, Take 1 tablet (20 mg total) by mouth daily., Disp: 30 tablet, Rfl: 3 .  diclofenac (VOLTAREN) 75 MG EC tablet, Take 1 tablet (75 mg total) by mouth 2 (two) times daily as needed., Disp: 60 tablet, Rfl: 1  Review of Systems  Constitutional: Positive for fatigue. Negative for appetite change, chills, diaphoresis, fever and unexpected weight change.  HENT: Positive for congestion and  sneezing. Negative for dental problem, drooling, ear pain, facial swelling, hearing loss, mouth sores, sore throat, trouble swallowing and voice change.   Eyes: Positive for visual disturbance. Negative for pain, discharge, redness and itching.  Respiratory: Positive for cough and shortness of breath. Negative for choking and wheezing.   Cardiovascular: Negative for chest pain, palpitations and leg swelling.  Gastrointestinal: Negative for abdominal pain, blood in stool, constipation, diarrhea and vomiting.  Endocrine: Positive for heat intolerance and polydipsia. Negative for cold intolerance.  Genitourinary: Negative for decreased urine volume, dysuria and hematuria.  Musculoskeletal: Positive for arthralgias, back pain and gait problem.  Skin: Negative for rash.  Allergic/Immunologic: Positive for environmental allergies.  Neurological: Positive for headaches. Negative for seizures, syncope and light-headedness.  Hematological: Negative for adenopathy.  Psychiatric/Behavioral: Positive for agitation and dysphoric mood. Negative for suicidal ideas. The patient is nervous/anxious.     Per HPI unless specifically indicated above     Objective:    BP 126/72 (BP Location: Right Arm, Patient Position: Sitting, Cuff Size: Large)   Pulse 88   Temp (!) 97.2 F (36.2 C) (Other (Comment))   Ht 5' 4.25" (1.632 m)   Wt 252 lb 8 oz (114.5 kg)   SpO2 98%   BMI 43.00 kg/m   Wt Readings from Last 3 Encounters:  05/15/17 252 lb 8  oz (114.5 kg)  12/27/16 247 lb 8 oz (112.3 kg)  11/21/16 246 lb 8 oz (111.8 kg)    Physical Exam  Constitutional: She is oriented to person, place, and time. She appears well-developed and well-nourished.  HENT:  Head: Normocephalic and atraumatic.  Neck: Neck supple.  Cardiovascular: Normal rate and regular rhythm.  Pulmonary/Chest: Effort normal and breath sounds normal.  Abdominal: Soft. Bowel sounds are normal. She exhibits no mass. There is no  hepatosplenomegaly. There is no tenderness.  Musculoskeletal: She exhibits no edema.       Right wrist: She exhibits decreased range of motion and tenderness. She exhibits no bony tenderness and no swelling.       Left wrist: She exhibits decreased range of motion and tenderness. She exhibits no bony tenderness and no swelling.  + phalen's test  Lymphadenopathy:    She has no cervical adenopathy.  Neurological: She is alert and oriented to person, place, and time.  Skin: Skin is warm and dry.  Psychiatric: She has a normal mood and affect. Her behavior is normal.  Vitals reviewed.       Assessment & Plan:   Encounter Diagnoses  Name Primary?  . Hyperlipidemia, unspecified hyperlipidemia type Yes  . Bilateral carpal tunnel syndrome   . Chronic obstructive pulmonary disease, unspecified COPD type (HCC)   . Cigarette nicotine dependence with nicotine-induced disorder   . Low back pain without sciatica, unspecified back pain laterality, unspecified chronicity   . Abnormal MRI, lumbar spine   . Obesity, morbid (HCC)   . Depression, unspecified depression type   . Anxiety   . Screening for breast cancer   . Screening for colon cancer       -Check fasting labs.  Will call with results -will Reorder screening mammogram -pt was given iFOBT for colon cancer screening -will Refer to orthopedist Ginette Otto([place])  for carpal and LBP -pt to continue to wear wrist splints.  Encouraged to ice 10-20 minutes 3-4 times daily.  IBU prn -Pt is given another [facility] application -Pt signed for her mother to get permission to speak with people about her care -pt to follow up  6 wk.  RTO sooner prn

## 2017-05-15 NOTE — Patient Instructions (Addendum)
-Financial counselor- [phone] -Blood tests at the lab  ____________________________________   Carpal Tunnel Syndrome Carpal tunnel syndrome is a condition that causes pain in your hand and arm. The carpal tunnel is a narrow area located on the palm side of your wrist. Repeated wrist motion or certain diseases may cause swelling within the tunnel. This swelling pinches the main nerve in the wrist (median nerve). What are the causes? This condition may be caused by:  Repeated wrist motions.  Wrist injuries.  Arthritis.  A cyst or tumor in the carpal tunnel.  Fluid buildup during pregnancy.  Sometimes the cause of this condition is not known. What increases the risk? This condition is more likely to develop in:  People who have jobs that cause them to repeatedly move their wrists in the same motion, such as Health visitorbutchers and cashiers.  Women.  People with certain conditions, such as: ? Diabetes. ? Obesity. ? An underactive thyroid (hypothyroidism). ? Kidney failure.  What are the signs or symptoms? Symptoms of this condition include:  A tingling feeling in your fingers, especially in your thumb, index, and middle fingers.  Tingling or numbness in your hand.  An aching feeling in your entire arm, especially when your wrist and elbow are bent for long periods of time.  Wrist pain that goes up your arm to your shoulder.  Pain that goes down into your palm or fingers.  A weak feeling in your hands. You may have trouble grabbing and holding items.  Your symptoms may feel worse during the night. How is this diagnosed? This condition is diagnosed with a medical history and physical exam. You may also have tests, including:  An electromyogram (EMG). This test measures electrical signals sent by your nerves into the muscles.  X-rays.  How is this treated? Treatment for this condition includes:  Lifestyle changes. It is important to stop doing or modify the activity  that caused your condition.  Physical or occupational therapy.  Medicines for pain and inflammation. This may include medicine that is injected into your wrist.  A wrist splint.  Surgery.  Follow these instructions at home: If you have a splint:  Wear it as told by your health care provider. Remove it only as told by your health care provider.  Loosen the splint if your fingers become numb and tingle, or if they turn cold and blue.  Keep the splint clean and dry. General instructions  Take over-the-counter and prescription medicines only as told by your health care provider.  Rest your wrist from any activity that may be causing your pain. If your condition is work related, talk to your employer about changes that can be made, such as getting a wrist pad to use while typing.  If directed, apply ice to the painful area: ? Put ice in a plastic bag. ? Place a towel between your skin and the bag. ? Leave the ice on for 20 minutes, 2-3 times per day.  Keep all follow-up visits as told by your health care provider. This is important.  Do any exercises as told by your health care provider, physical therapist, or occupational therapist. Contact a health care provider if:  You have new symptoms.  Your pain is not controlled with medicines.  Your symptoms get worse. This information is not intended to replace advice given to you by your health care provider. Make sure you discuss any questions you have with your health care provider. Document Released: 02/25/2000 Document Revised: 07/08/2015 Document Reviewed:  07/15/2014 Elsevier Interactive Patient Education  [employer].

## 2017-05-16 ENCOUNTER — Other Ambulatory Visit (HOSPITAL_COMMUNITY)
Admission: RE | Admit: 2017-05-16 | Discharge: 2017-05-16 | Disposition: A | Discharge status: Home / Self Care | Payer: Self-pay | Source: Ambulatory Visit | Attending: Physician Assistant | Admitting: Physician Assistant

## 2017-05-16 ENCOUNTER — Other Ambulatory Visit: Payer: Self-pay | Admitting: Physician Assistant

## 2017-05-16 DIAGNOSIS — M545 Low back pain, unspecified: Secondary | ICD-10-CM | POA: Insufficient documentation

## 2017-05-16 DIAGNOSIS — E785 Hyperlipidemia, unspecified: Secondary | ICD-10-CM | POA: Insufficient documentation

## 2017-05-16 DIAGNOSIS — G5603 Carpal tunnel syndrome, bilateral upper limbs: Secondary | ICD-10-CM | POA: Insufficient documentation

## 2017-05-16 DIAGNOSIS — Z1211 Encounter for screening for malignant neoplasm of colon: Secondary | ICD-10-CM

## 2017-05-16 DIAGNOSIS — R937 Abnormal findings on diagnostic imaging of other parts of musculoskeletal system: Secondary | ICD-10-CM | POA: Insufficient documentation

## 2017-05-16 DIAGNOSIS — Z1231 Encounter for screening mammogram for malignant neoplasm of breast: Secondary | ICD-10-CM

## 2017-05-16 LAB — COMPREHENSIVE METABOLIC PANEL
ALT: 37 U/L (ref 14–54)
AST: 27 U/L (ref 15–41)
Albumin: 4 g/dL (ref 3.5–5.0)
Alkaline Phosphatase: 87 U/L (ref 38–126)
Anion gap: 10 (ref 5–15)
BUN: 14 mg/dL (ref 6–20)
CO2: 26 mmol/L (ref 22–32)
Calcium: 9 mg/dL (ref 8.9–10.3)
Chloride: 100 mmol/L — ABNORMAL LOW (ref 101–111)
Creatinine, Ser: 0.83 mg/dL (ref 0.44–1.00)
GFR calc Af Amer: >60 mL/min (ref >60)
GFR calc non Af Amer: >60 mL/min (ref >60)
Glucose, Bld: 102 mg/dL — ABNORMAL HIGH (ref 65–99)
Potassium: 4.7 mmol/L (ref 3.5–5.1)
Sodium: 136 mmol/L (ref 135–145)
Total Bilirubin: 0.7 mg/dL (ref 0.3–1.2)
Total Protein: 7.6 g/dL (ref 6.5–8.1)

## 2017-05-16 LAB — LIPID PANEL
Cholesterol: 186 mg/dL (ref 0–200)
HDL: 37 mg/dL — ABNORMAL LOW (ref >40)
LDL Cholesterol: 118 mg/dL — ABNORMAL HIGH (ref 0–99)
Total CHOL/HDL Ratio: 5 RATIO
Triglycerides: 156 mg/dL — ABNORMAL HIGH (ref <150)
VLDL: 31 mg/dL (ref 0–40)

## 2017-05-21 ENCOUNTER — Encounter: Payer: Self-pay | Admitting: Physician Assistant

## 2017-05-21 ENCOUNTER — Ambulatory Visit: Payer: Self-pay | Admitting: Physician Assistant

## 2017-05-21 VITALS — BP 132/72 | HR 102 | Temp 97.6°F | Ht 64.25 in | Wt 254.2 lb

## 2017-05-21 DIAGNOSIS — F17219 Nicotine dependence, cigarettes, with unspecified nicotine-induced disorders: Secondary | ICD-10-CM

## 2017-05-21 DIAGNOSIS — R059 Cough, unspecified: Secondary | ICD-10-CM

## 2017-05-21 DIAGNOSIS — H6123 Impacted cerumen, bilateral: Secondary | ICD-10-CM

## 2017-05-21 DIAGNOSIS — J440 Chronic obstructive pulmonary disease with acute lower respiratory infection: Principal | ICD-10-CM

## 2017-05-21 DIAGNOSIS — J209 Acute bronchitis, unspecified: Secondary | ICD-10-CM

## 2017-05-21 DIAGNOSIS — R062 Wheezing: Secondary | ICD-10-CM

## 2017-05-21 DIAGNOSIS — R05 Cough: Secondary | ICD-10-CM

## 2017-05-21 DIAGNOSIS — R0602 Shortness of breath: Secondary | ICD-10-CM

## 2017-05-21 MED ORDER — PREDNISONE 10 MG PO TABS: ORAL_TABLET | ORAL | 0 refills | Status: DC

## 2017-05-21 MED ORDER — IPRATROPIUM-ALBUTEROL 0.5-2.5 (3) MG/3ML IN SOLN
3.0000 mL | Freq: Once | RESPIRATORY_TRACT | Status: AC
  Administered 2017-05-21: 3 mL via RESPIRATORY_TRACT

## 2017-05-21 NOTE — Progress Notes (Signed)
BP 132/72 (BP Location: Right Arm, Patient Position: Sitting, Cuff Size: Large)   Pulse (!) 102   Temp 97.6 F (36.4 C) (Oral)   Ht 5' 4.25" (1.632 m)   Wt 254 lb 4 oz (115.3 kg)   SpO2 99%   BMI 43.30 kg/m    Subjective:    Patient ID: Lori Calderon, female    DOB: [birth year], 52 y.o.   MRN: [id number]  HPI: Lori Calderon is a 52 y.o. female presenting on 05/21/2017 for Sore Throat (woke up this morning with sore throat and L ear pain) and Otalgia   HPI   Chief Complaint  Patient presents with  . Sore Throat    woke up this morning with sore throat and L ear pain  . Otalgia   Also coughing and SOB.    Pt continues to smoke  Relevant past medical, surgical, family and social history reviewed and updated as indicated. Interim medical history since our last visit reviewed. Allergies and medications reviewed and updated.   Current Outpatient Medications:  .  albuterol (PROVENTIL HFA;VENTOLIN HFA) 108 (90 Base) MCG/ACT inhaler, Inhale 2 puffs into the lungs every 6 (six) hours as needed for wheezing or shortness of breath., Disp: 3 Inhaler, Rfl: 1 .  atorvastatin (LIPITOR) 20 MG tablet, Take 1 tablet (20 mg total) by mouth daily., Disp: 90 tablet, Rfl: 2 .  citalopram (CELEXA) 20 MG tablet, Take 1 tablet (20 mg total) by mouth daily., Disp: 30 tablet, Rfl: 3 .  diclofenac (VOLTAREN) 75 MG EC tablet, Take 1 tablet (75 mg total) by mouth 2 (two) times daily as needed., Disp: 60 tablet, Rfl: 1   Review of Systems  Constitutional: Positive for fatigue. Negative for appetite change, chills, diaphoresis, fever and unexpected weight change.  HENT: Positive for congestion, ear pain and sore throat. Negative for dental problem, drooling, facial swelling, hearing loss, mouth sores, sneezing, trouble swallowing and voice change.   Eyes: Negative for pain, discharge, redness, itching and visual disturbance.  Respiratory: Positive for cough, shortness of breath and wheezing.  Negative for choking.   Cardiovascular: Negative for chest pain, palpitations and leg swelling.  Gastrointestinal: Negative for abdominal pain, blood in stool, constipation, diarrhea and vomiting.  Endocrine: Positive for cold intolerance, heat intolerance and polydipsia.  Genitourinary: Negative for decreased urine volume, dysuria and hematuria.  Musculoskeletal: Positive for arthralgias, back pain and gait problem.  Skin: Negative for rash.  Allergic/Immunologic: Negative for environmental allergies.  Neurological: Positive for headaches. Negative for seizures, syncope and light-headedness.  Hematological: Negative for adenopathy.  Psychiatric/Behavioral: Positive for dysphoric mood. Negative for agitation and suicidal ideas. The patient is nervous/anxious.     Per HPI unless specifically indicated above     Objective:    BP 132/72 (BP Location: Right Arm, Patient Position: Sitting, Cuff Size: Large)   Pulse (!) 102   Temp 97.6 F (36.4 C) (Oral)   Ht 5' 4.25" (1.632 m)   Wt 254 lb 4 oz (115.3 kg)   SpO2 99%   BMI 43.30 kg/m   Wt Readings from Last 3 Encounters:  05/21/17 254 lb 4 oz (115.3 kg)  05/15/17 252 lb 8 oz (114.5 kg)  12/27/16 247 lb 8 oz (112.3 kg)    Physical Exam  Constitutional: She is oriented to person, place, and time. She appears well-developed and well-nourished.  HENT:  Head: Normocephalic and atraumatic.  Right Ear: Hearing, tympanic membrane and external ear normal. A foreign body is present.  Left Ear: Hearing, tympanic membrane and external ear normal. A foreign body is present.  Nose: Mucosal edema and rhinorrhea present.  Mouth/Throat: Uvula is midline and oropharynx is clear and moist. No oropharyngeal exudate.  Cerumen bilaterally  Neck: Neck supple.  Cardiovascular: Normal rate and regular rhythm.  Pulmonary/Chest: Effort normal. No accessory muscle usage. No tachypnea and no bradypnea. No respiratory distress. She has wheezes. She has no  rhonchi. She has no rales.  Wheeze improved after nebulizer treatment  Lymphadenopathy:    She has no cervical adenopathy.  Neurological: She is alert and oriented to person, place, and time.  Skin: Skin is warm and dry.  Psychiatric: She has a normal mood and affect. Her behavior is normal.  Vitals reviewed.       Assessment & Plan:    Encounter Diagnoses  Name Primary?  . Acute bronchitis with COPD (HCC) Yes  . SOB (shortness of breath)   . Wheezing   . Cough   . Bilateral impacted cerumen   . Cigarette nicotine dependence with nicotine-induced disorder      -Rest.  Fluids -avoid smoking -rx prednisone taper -OTCs prn -debrox drops daily to L ear with RTO on Thursday for repeat lavage. -RTO sooner prn -gave note to RTW on friday

## 2017-05-21 NOTE — Patient Instructions (Signed)

## 2017-05-24 ENCOUNTER — Ambulatory Visit: Payer: Self-pay | Admitting: Physician Assistant

## 2017-05-24 ENCOUNTER — Encounter: Payer: Self-pay | Admitting: Physician Assistant

## 2017-05-24 ENCOUNTER — Other Ambulatory Visit (HOSPITAL_COMMUNITY)
Admission: RE | Admit: 2017-05-24 | Discharge: 2017-05-24 | Disposition: A | Discharge status: Home / Self Care | Payer: Self-pay | Source: Ambulatory Visit | Attending: Physician Assistant | Admitting: Physician Assistant

## 2017-05-24 ENCOUNTER — Ambulatory Visit (HOSPITAL_COMMUNITY)
Admission: RE | Admit: 2017-05-24 | Discharge: 2017-05-24 | Disposition: A | Discharge status: Home / Self Care | Payer: Self-pay | Source: Ambulatory Visit | Attending: Physician Assistant | Admitting: Physician Assistant

## 2017-05-24 VITALS — BP 125/67 | HR 105 | Temp 98.1°F | Ht 64.25 in | Wt 253.0 lb

## 2017-05-24 DIAGNOSIS — R0602 Shortness of breath: Secondary | ICD-10-CM | POA: Insufficient documentation

## 2017-05-24 DIAGNOSIS — R062 Wheezing: Secondary | ICD-10-CM | POA: Insufficient documentation

## 2017-05-24 DIAGNOSIS — J440 Chronic obstructive pulmonary disease with acute lower respiratory infection: Secondary | ICD-10-CM | POA: Insufficient documentation

## 2017-05-24 DIAGNOSIS — J209 Acute bronchitis, unspecified: Secondary | ICD-10-CM | POA: Insufficient documentation

## 2017-05-24 DIAGNOSIS — F17200 Nicotine dependence, unspecified, uncomplicated: Secondary | ICD-10-CM | POA: Insufficient documentation

## 2017-05-24 LAB — BASIC METABOLIC PANEL
Anion gap: 10 (ref 5–15)
BUN: 19 mg/dL (ref 6–20)
CO2: 26 mmol/L (ref 22–32)
Calcium: 8.8 mg/dL — ABNORMAL LOW (ref 8.9–10.3)
Chloride: 100 mmol/L — ABNORMAL LOW (ref 101–111)
Creatinine, Ser: 0.81 mg/dL (ref 0.44–1.00)
GFR calc Af Amer: >60 mL/min (ref >60)
GFR calc non Af Amer: >60 mL/min (ref >60)
Glucose, Bld: 144 mg/dL — ABNORMAL HIGH (ref 65–99)
Potassium: 3.8 mmol/L (ref 3.5–5.1)
Sodium: 136 mmol/L (ref 135–145)

## 2017-05-24 LAB — CBC WITH DIFFERENTIAL/PLATELET
Basophils Absolute: 0 10*3/uL (ref 0.0–0.1)
Basophils Relative: 0 %
Eosinophils Absolute: 0.1 10*3/uL (ref 0.0–0.7)
Eosinophils Relative: 1 %
HCT: 36 % (ref 36.0–46.0)
Hemoglobin: 11.4 g/dL — ABNORMAL LOW (ref 12.0–15.0)
Lymphocytes Relative: 24 %
Lymphs Abs: 2 10*3/uL (ref 0.7–4.0)
MCH: 29.8 pg (ref 26.0–34.0)
MCHC: 31.7 g/dL (ref 30.0–36.0)
MCV: 94.2 fL (ref 78.0–100.0)
Monocytes Absolute: 0.7 10*3/uL (ref 0.1–1.0)
Monocytes Relative: 9 %
Neutro Abs: 5.5 10*3/uL (ref 1.7–7.7)
Neutrophils Relative %: 66 %
Platelets: 288 10*3/uL (ref 150–400)
RBC: 3.82 MIL/uL — ABNORMAL LOW (ref 3.87–5.11)
RDW: 14.2 % (ref 11.5–15.5)
WBC: 8.3 10*3/uL (ref 4.0–10.5)

## 2017-05-24 MED ORDER — ALBUTEROL SULFATE (2.5 MG/3ML) 0.083% IN NEBU
2.5000 mg | INHALATION_SOLUTION | Freq: Once | RESPIRATORY_TRACT | Status: AC
Start: 2017-05-24 — End: 2017-05-24
  Administered 2017-05-24: 2.5 mg via RESPIRATORY_TRACT

## 2017-05-24 MED ORDER — ALBUTEROL SULFATE (2.5 MG/3ML) 0.083% IN NEBU: 2.5000 mg | INHALATION_SOLUTION | Freq: Four times a day (QID) | RESPIRATORY_TRACT | 1 refills | Status: DC | PRN

## 2017-05-24 MED ORDER — AZITHROMYCIN 250 MG PO TABS: ORAL_TABLET | ORAL | 0 refills | Status: DC

## 2017-05-24 MED ORDER — PROMETHAZINE-DM 6.25-15 MG/5ML PO SYRP: 5.0000 mL | ORAL_SOLUTION | Freq: Four times a day (QID) | ORAL | 0 refills | Status: DC | PRN

## 2017-05-24 NOTE — Progress Notes (Signed)
BP 125/67 (BP Location: Right Arm, Patient Position: Sitting, Cuff Size: Normal)   Pulse (!) 105   Temp 98.1 F (36.7 C) (Other (Comment))   Ht 5' 4.25" (1.632 m)   Wt 253 lb (114.8 kg)   SpO2 95%   BMI 43.09 kg/m    Subjective:    Patient ID: Lori Calderon, female    DOB: 1966-03-12, 52 y.o.   MRN: [id number]  HPI: Lori Calderon is a 52 y.o. female presenting on 05/24/2017 for Cerumen Impaction (c/o left eye red and itching since yesterday AM)   HPI Pt seen here earlier this week and rx prednisone.  She is returning today to recheck cerumen and breathing.  She states ear improved, breathing has not.  Pt complains of coughing, sob.  Some bleeding from the nose.  Eyes itchy and red.  No fevers.    Relevant past medical, surgical, family and social history reviewed and updated as indicated. Interim medical history since our last visit reviewed. Allergies and medications reviewed and updated.   Current Outpatient Medications:  .  albuterol (PROVENTIL HFA;VENTOLIN HFA) 108 (90 Base) MCG/ACT inhaler, Inhale 2 puffs into the lungs every 6 (six) hours as needed for wheezing or shortness of breath., Disp: 3 Inhaler, Rfl: 1 .  atorvastatin (LIPITOR) 20 MG tablet, Take 1 tablet (20 mg total) by mouth daily., Disp: 90 tablet, Rfl: 2 .  citalopram (CELEXA) 20 MG tablet, Take 1 tablet (20 mg total) by mouth daily., Disp: 30 tablet, Rfl: 3 .  diclofenac (VOLTAREN) 75 MG EC tablet, Take 1 tablet (75 mg total) by mouth 2 (two) times daily as needed., Disp: 60 tablet, Rfl: 1 .  predniSONE (DELTASONE) 10 MG tablet, Day 1 take 6 tablets po qam. Day 2 take 5 tablets po qam. Day 3 take 4 tablets po qam. Day 4 take 3 tablets po qam. Day 5 take 2 tablets po qam. Day 6 take 1 tablet po qam., Disp: 21 tablet, Rfl: 0   Review of Systems  Constitutional: Positive for fatigue. Negative for appetite change, chills, diaphoresis, fever and unexpected weight change.  HENT: Positive for congestion,  hearing loss, sore throat, trouble swallowing and voice change. Negative for dental problem, drooling, ear pain, facial swelling, mouth sores and sneezing.   Eyes: Positive for discharge, redness, itching and visual disturbance. Negative for pain.  Respiratory: Positive for cough, shortness of breath and wheezing. Negative for choking.   Cardiovascular: Negative for chest pain, palpitations and leg swelling.  Gastrointestinal: Negative for abdominal pain, blood in stool, constipation, diarrhea and vomiting.  Endocrine: Positive for cold intolerance, heat intolerance and polydipsia.  Genitourinary: Negative for decreased urine volume, dysuria and hematuria.  Musculoskeletal: Positive for arthralgias, back pain and gait problem.  Skin: Negative for rash.  Allergic/Immunologic: Negative for environmental allergies.  Neurological: Positive for headaches. Negative for seizures, syncope and light-headedness.  Hematological: Negative for adenopathy.  Psychiatric/Behavioral: Positive for agitation. Negative for dysphoric mood and suicidal ideas. The patient is nervous/anxious.     Per HPI unless specifically indicated above     Objective:    BP 125/67 (BP Location: Right Arm, Patient Position: Sitting, Cuff Size: Normal)   Pulse (!) 105   Temp 98.1 F (36.7 C) (Other (Comment))   Ht 5' 4.25" (1.632 m)   Wt 253 lb (114.8 kg)   SpO2 95%   BMI 43.09 kg/m   Wt Readings from Last 3 Encounters:  05/24/17 253 lb (114.8 kg)  05/21/17 254 lb  4 oz (115.3 kg)  05/15/17 252 lb 8 oz (114.5 kg)    Physical Exam  Constitutional: She is oriented to person, place, and time. She appears well-developed and well-nourished.  HENT:  Head: Normocephalic and atraumatic.  Cerumen gone from ear canal.  Mildly red L TM. + nasal congestion  Eyes: Right conjunctiva is injected. Left conjunctiva is injected.  Very mildly irritated conjunctiva bilaterally.  Not red.   Cardiovascular: Regular rhythm and normal  heart sounds.  Pulmonary/Chest: Effort normal. No tachypnea and no bradypnea. No respiratory distress. She has wheezes. She has no rhonchi. She has no rales. She exhibits no tenderness.  Abdominal: Soft. Bowel sounds are normal. She exhibits no distension. There is no tenderness.  Neurological: She is alert and oriented to person, place, and time.  Skin: Skin is warm and dry.  Psychiatric: She has a normal mood and affect. Her behavior is normal.  Nursing note and vitals reviewed.       Assessment & Plan:    Encounter Diagnoses  Name Primary?  . Acute bronchitis with COPD (HCC)   . SOB (shortness of breath)   . Wheezing Yes    -discussed with pt getting labs and cxr as outpatient or if she would prefer to go to ER to get labs and xray to see if she needed admission and pt preferred to not go to the ER -will Check cbc, bmp, cxr today -rx zpack, nebulizer albuterol and protheazine dm -pt counseled to stop taking her citalopram for a week -she is to continue the prednisone -Pt is given a loaner nebulizer machine -pt is counseled that If she gets worse, she should go to ER -pt is to follow up on Monday for recheck

## 2017-05-24 NOTE — Patient Instructions (Signed)
Do not take your citalopram while on the azithromycin.

## 2017-05-28 ENCOUNTER — Encounter: Payer: Self-pay | Admitting: Physician Assistant

## 2017-05-28 ENCOUNTER — Ambulatory Visit: Payer: Self-pay | Admitting: Physician Assistant

## 2017-05-28 VITALS — BP 130/70 | HR 105 | Temp 97.0°F | Ht 64.25 in | Wt 252.0 lb

## 2017-05-28 DIAGNOSIS — R062 Wheezing: Secondary | ICD-10-CM

## 2017-05-28 DIAGNOSIS — J440 Chronic obstructive pulmonary disease with acute lower respiratory infection: Principal | ICD-10-CM

## 2017-05-28 DIAGNOSIS — F17219 Nicotine dependence, cigarettes, with unspecified nicotine-induced disorders: Secondary | ICD-10-CM

## 2017-05-28 DIAGNOSIS — J209 Acute bronchitis, unspecified: Secondary | ICD-10-CM

## 2017-05-28 NOTE — Progress Notes (Signed)
BP 130/70 (BP Location: Left Arm, Patient Position: Sitting, Cuff Size: Normal)   Pulse (!) 105   Temp (!) 97 F (36.1 C)   Ht 5' 4.25" (1.632 m)   Wt 252 lb (114.3 kg)   SpO2 96%   BMI 42.92 kg/m    Subjective:    Patient ID: Lori Calderon, female    DOB: Jul 23, 1965, 52 y.o.   MRN: [id number]  HPI: Lori Calderon is a 52 y.o. female presenting on 05/28/2017 for Follow-up   HPI  Pt is feelig better but is not back to her usual self.   Pt compleeted her zpack and prednisone.  She says the nebulizer helps.     Relevant past medical, surgical, family and social history reviewed and updated as indicated. Interim medical history since our last visit reviewed. Allergies and medications reviewed and updated.   Current Outpatient Medications:  .  albuterol (PROVENTIL HFA;VENTOLIN HFA) 108 (90 Base) MCG/ACT inhaler, Inhale 2 puffs into the lungs every 6 (six) hours as needed for wheezing or shortness of breath., Disp: 3 Inhaler, Rfl: 1 .  albuterol (PROVENTIL) (2.5 MG/3ML) 0.083% nebulizer solution, Take 3 mLs (2.5 mg total) by nebulization every 6 (six) hours as needed for wheezing or shortness of breath., Disp: 150 mL, Rfl: 1 .  atorvastatin (LIPITOR) 20 MG tablet, Take 1 tablet (20 mg total) by mouth daily., Disp: 90 tablet, Rfl: 2 .  citalopram (CELEXA) 20 MG tablet, Take 1 tablet (20 mg total) by mouth daily., Disp: 30 tablet, Rfl: 3 .  diclofenac (VOLTAREN) 75 MG EC tablet, Take 1 tablet (75 mg total) by mouth 2 (two) times daily as needed., Disp: 60 tablet, Rfl: 1 .  promethazine-dextromethorphan (PROMETHAZINE-DM) 6.25-15 MG/5ML syrup, Take 5 mLs by mouth 4 (four) times daily as needed for cough., Disp: 118 mL, Rfl: 0  Review of Systems  Constitutional: Positive for fatigue. Negative for appetite change, chills, diaphoresis, fever and unexpected weight change.  HENT: Positive for congestion and hearing loss. Negative for dental problem, drooling, ear pain, facial swelling,  mouth sores, sneezing, sore throat, trouble swallowing and voice change.   Eyes: Negative for pain, discharge, redness, itching and visual disturbance.  Respiratory: Positive for cough, shortness of breath and wheezing. Negative for choking.   Cardiovascular: Negative for chest pain, palpitations and leg swelling.  Gastrointestinal: Negative for abdominal pain, blood in stool, constipation, diarrhea and vomiting.  Endocrine: Positive for cold intolerance, heat intolerance and polydipsia.  Genitourinary: Negative for decreased urine volume, dysuria and hematuria.  Musculoskeletal: Positive for back pain. Negative for arthralgias and gait problem.  Skin: Negative for rash.  Allergic/Immunologic: Negative for environmental allergies.  Neurological: Negative for seizures, syncope, light-headedness and headaches.  Hematological: Negative for adenopathy.  Psychiatric/Behavioral: Negative for agitation, dysphoric mood and suicidal ideas. The patient is nervous/anxious.     Per HPI unless specifically indicated above     Objective:    BP 130/70 (BP Location: Left Arm, Patient Position: Sitting, Cuff Size: Normal)   Pulse (!) 105   Temp (!) 97 F (36.1 C)   Ht 5' 4.25" (1.632 m)   Wt 252 lb (114.3 kg)   SpO2 96%   BMI 42.92 kg/m   Wt Readings from Last 3 Encounters:  05/28/17 252 lb (114.3 kg)  05/24/17 253 lb (114.8 kg)  05/21/17 254 lb 4 oz (115.3 kg)    Physical Exam  Constitutional: She is oriented to person, place, and time. She appears well-developed and well-nourished.  HENT:  Head: Normocephalic and atraumatic.  Right Ear: Hearing, tympanic membrane, external ear and ear canal normal.  Left Ear: Hearing, tympanic membrane, external ear and ear canal normal.  Nose: Nose normal.  Mouth/Throat: Uvula is midline and oropharynx is clear and moist. No oropharyngeal exudate.  Neck: Neck supple.  Cardiovascular: Normal rate and regular rhythm.  Pulmonary/Chest: Effort normal. No  tachypnea and no bradypnea. No respiratory distress. She has wheezes. She has no rhonchi. She has no rales.  Abdominal: Soft. Bowel sounds are normal. She exhibits no mass. There is no hepatosplenomegaly. There is no tenderness.  Musculoskeletal: She exhibits no edema.  Lymphadenopathy:    She has no cervical adenopathy.  Neurological: She is alert and oriented to person, place, and time.  Skin: Skin is warm and dry.  Psychiatric: She has a normal mood and affect. Her behavior is normal.  Vitals reviewed.   Results for orders placed or performed during the hospital encounter of 05/24/17  Basic metabolic panel  Result Value Ref Range   Sodium 136 135 - 145 mmol/L   Potassium 3.8 3.5 - 5.1 mmol/L   Chloride 100 (L) 101 - 111 mmol/L   CO2 26 22 - 32 mmol/L   Glucose, Bld 144 (H) 65 - 99 mg/dL   BUN 19 6 - 20 mg/dL   Creatinine, Ser 4.090.81 0.44 - 1.00 mg/dL   Calcium 8.8 (L) 8.9 - 10.3 mg/dL   GFR calc non Af Amer >60 >60 mL/min   GFR calc Af Amer >60 >60 mL/min   Anion gap 10 5 - 15  CBC w/Diff/Platelet  Result Value Ref Range   WBC 8.3 4.0 - 10.5 K/uL   RBC 3.82 (L) 3.87 - 5.11 MIL/uL   Hemoglobin 11.4 (L) 12.0 - 15.0 g/dL   HCT 81.136.0 91.436.0 - 78.246.0 %   MCV 94.2 78.0 - 100.0 fL   MCH 29.8 26.0 - 34.0 pg   MCHC 31.7 30.0 - 36.0 g/dL   RDW 95.614.2 21.311.5 - 08.615.5 %   Platelets 288 150 - 400 K/uL   Neutrophils Relative % 66 %   Neutro Abs 5.5 1.7 - 7.7 K/uL   Lymphocytes Relative 24 %   Lymphs Abs 2.0 0.7 - 4.0 K/uL   Monocytes Relative 9 %   Monocytes Absolute 0.7 0.1 - 1.0 K/uL   Eosinophils Relative 1 %   Eosinophils Absolute 0.1 0.0 - 0.7 K/uL   Basophils Relative 0 %   Basophils Absolute 0.0 0.0 - 0.1 K/uL      Assessment & Plan:   Encounter Diagnoses  Name Primary?  . Acute bronchitis with COPD (HCC) Yes  . Wheezing   . Cigarette nicotine dependence with nicotine-induced disorder     -pt to continue to rest, use nebs as needed, avoid smoking -gave note to remain out of  work another week -Pt to keep loaner nebulizer machine.  She is aked to return it when she doen'st need it or at her follow up appointment in 1 month.   She is to RTO sooner prn

## 2017-06-04 ENCOUNTER — Ambulatory Visit (INDEPENDENT_AMBULATORY_CARE_PROVIDER_SITE_OTHER): Payer: Self-pay | Admitting: Otolaryngology

## 2017-06-04 DIAGNOSIS — H6983 Other specified disorders of Eustachian tube, bilateral: Secondary | ICD-10-CM

## 2017-06-04 DIAGNOSIS — H906 Mixed conductive and sensorineural hearing loss, bilateral: Secondary | ICD-10-CM

## 2017-06-04 DIAGNOSIS — H6503 Acute serous otitis media, bilateral: Secondary | ICD-10-CM

## 2017-06-26 ENCOUNTER — Ambulatory Visit: Payer: Self-pay | Admitting: Physician Assistant

## 2017-06-26 ENCOUNTER — Encounter: Payer: Self-pay | Admitting: Physician Assistant

## 2017-06-26 VITALS — BP 122/68 | HR 96 | Temp 98.1°F | Ht 64.25 in | Wt 251.0 lb

## 2017-06-26 DIAGNOSIS — J449 Chronic obstructive pulmonary disease, unspecified: Secondary | ICD-10-CM

## 2017-06-26 DIAGNOSIS — F32A Depression, unspecified: Secondary | ICD-10-CM

## 2017-06-26 DIAGNOSIS — F419 Anxiety disorder, unspecified: Secondary | ICD-10-CM

## 2017-06-26 DIAGNOSIS — F329 Major depressive disorder, single episode, unspecified: Secondary | ICD-10-CM

## 2017-06-26 DIAGNOSIS — E785 Hyperlipidemia, unspecified: Secondary | ICD-10-CM

## 2017-06-26 DIAGNOSIS — D649 Anemia, unspecified: Secondary | ICD-10-CM

## 2017-06-26 DIAGNOSIS — F17219 Nicotine dependence, cigarettes, with unspecified nicotine-induced disorders: Secondary | ICD-10-CM

## 2017-06-26 MED ORDER — ALBUTEROL SULFATE HFA 108 (90 BASE) MCG/ACT IN AERS: 2.0000 | INHALATION_SPRAY | Freq: Four times a day (QID) | RESPIRATORY_TRACT | 1 refills | Status: DC | PRN

## 2017-06-26 NOTE — Patient Instructions (Signed)
Varenicline oral tablets What is this medicine? VARENICLINE (var EN i kleen) is used to help people quit smoking. It can reduce the symptoms caused by stopping smoking. It is used with a patient support program recommended by your physician. This medicine may be used for other purposes; ask your health care provider or pharmacist if you have questions. COMMON BRAND NAME(S): Chantix What should I tell my health care provider before I take this medicine? They need to know if you have any of these conditions: -bipolar disorder, depression, schizophrenia or other mental illness -heart disease -if you often drink alcohol -kidney disease -peripheral vascular disease -seizures -stroke -suicidal thoughts, plans, or attempt; a previous suicide attempt by you or a family member -an unusual or allergic reaction to varenicline, other medicines, foods, dyes, or preservatives -pregnant or trying to get pregnant -breast-feeding How should I use this medicine? Take this medicine by mouth after eating. Take with a full glass of water. Follow the directions on the prescription label. Take your doses at regular intervals. Do not take your medicine more often than directed. There are 3 ways you can use this medicine to help you quit smoking; talk to your health care professional to decide which plan is right for you: 1) you can choose a quit date and start this medicine 1 week before the quit date, or, 2) you can start taking this medicine before you choose a quit date, and then pick a quit date between day 8 and 35 days of treatment, or, 3) if you are not sure that you are able or willing to quit smoking right away, start taking this medicine and slowly decrease the amount you smoke as directed by your health care professional with the goal of being cigarette-free by week 12 of treatment. Stick to your plan; ask about support groups or other ways to help you remain cigarette-free. If you are motivated to quit  smoking and did not succeed during a previous attempt with this medicine for reasons other than side effects, or if you returned to smoking after this treatment, speak with your health care professional about whether another course of this medicine may be right for you. A special MedGuide will be given to you by the pharmacist with each prescription and refill. Be sure to read this information carefully each time. Talk to your pediatrician regarding the use of this medicine in children. This medicine is not approved for use in children. Overdosage: If you think you have taken too much of this medicine contact a poison control center or emergency room at once. NOTE: This medicine is only for you. Do not share this medicine with others. What if I miss a dose? If you miss a dose, take it as soon as you can. If it is almost time for your next dose, take only that dose. Do not take double or extra doses. What may interact with this medicine? -alcohol or any product that contains alcohol -insulin -other stop smoking aids -theophylline -warfarin This list may not describe all possible interactions. Give your health care provider a list of all the medicines, herbs, non-prescription drugs, or dietary supplements you use. Also tell them if you smoke, drink alcohol, or use illegal drugs. Some items may interact with your medicine. What should I watch for while using this medicine? Visit your doctor or health care professional for regular check ups. Ask for ongoing advice and encouragement from your doctor or healthcare professional, friends, and family to help you quit. If   you smoke while on this medication, quit again Your mouth may get dry. Chewing sugarless gum or sucking hard candy, and drinking plenty of water may help. Contact your doctor if the problem does not go away or is severe. You may get drowsy or dizzy. Do not drive, use machinery, or do anything that needs mental alertness until you know how  this medicine affects you. Do not stand or sit up quickly, especially if you are an older patient. This reduces the risk of dizzy or fainting spells. Sleepwalking can happen during treatment with this medicine, and can sometimes lead to behavior that is harmful to you, other people, or property. Stop taking this medicine and tell your doctor if you start sleepwalking or have other unusual sleep-related activity. Decrease the amount of alcoholic beverages that you drink during treatment with this medicine until you know if this medicine affects your ability to tolerate alcohol. Some people have experienced increased drunkenness (intoxication), unusual or sometimes aggressive behavior, or no memory of things that have happened (amnesia) during treatment with this medicine. The use of this medicine may increase the chance of suicidal thoughts or actions. Pay special attention to how you are responding while on this medicine. Any worsening of mood, or thoughts of suicide or dying should be reported to your health care professional right away. What side effects may I notice from receiving this medicine? Side effects that you should report to your doctor or health care professional as soon as possible: -allergic reactions like skin rash, itching or hives, swelling of the face, lips, tongue, or throat -acting aggressive, being angry or violent, or acting on dangerous impulses -breathing problems -changes in vision -chest pain or chest tightness -confusion, trouble speaking or understanding -new or worsening depression, anxiety, or panic attacks -extreme increase in activity and talking (mania) -fast, irregular heartbeat -feeling faint or lightheaded, falls -fever -pain in legs when walking -problems with balance, talking, walking -redness, blistering, peeling or loosening of the skin, including inside the mouth -ringing in ears -seeing or hearing things that aren't there  (hallucinations) -seizures -sleepwalking -sudden numbness or weakness of the face, arm or leg -thoughts about suicide or dying, or attempts to commit suicide -trouble passing urine or change in the amount of urine -unusual bleeding or bruising -unusually weak or tired Side effects that usually do not require medical attention (report to your doctor or health care professional if they continue or are bothersome): -constipation -headache -nausea, vomiting -strange dreams -stomach gas -trouble sleeping This list may not describe all possible side effects. Call your doctor for medical advice about side effects. You may report side effects to FDA at 1-800-FDA-1088. Where should I keep my medicine? Keep out of the reach of children. Store at room temperature between 15 and 30 degrees C (59 and 86 degrees F). Throw away any unused medicine after the expiration date. NOTE: This sheet is a summary. It may not cover all possible information. If you have questions about this medicine, talk to your doctor, pharmacist, or health care provider.  2018 Elsevier/Gold Standard (2014-11-12 16:14:23)  

## 2017-06-26 NOTE — Progress Notes (Signed)
BP 122/68 (BP Location: Left Arm, Patient Position: Sitting, Cuff Size: Large)   Pulse 96   Temp 98.1 F (36.7 C) (Other (Comment))   Ht 5' 4.25" (1.632 m)   Wt 251 lb (113.9 kg)   SpO2 96%   BMI 42.75 kg/m    Subjective:    Patient ID: Lori PinesMechelle R Folsom, female    DOB: 09/03/1965, 52 y.o.   MRN: [id number]  HPI: Lori Calderon is a 52 y.o. female presenting on 06/26/2017 for Follow-up   HPI   Pt says she is doing better but is not back to 100 % yet (from when she was sick with bronchitis)  She is still smoking some.   Relevant past medical, surgical, family and social history reviewed and updated as indicated. Interim medical history since our last visit reviewed. Allergies and medications reviewed and updated.   Current Outpatient Medications:  .  albuterol (PROVENTIL HFA;VENTOLIN HFA) 108 (90 Base) MCG/ACT inhaler, Inhale 2 puffs into the lungs every 6 (six) hours as needed for wheezing or shortness of breath., Disp: 3 Inhaler, Rfl: 1 .  atorvastatin (LIPITOR) 20 MG tablet, Take 1 tablet (20 mg total) by mouth daily., Disp: 90 tablet, Rfl: 2 .  citalopram (CELEXA) 20 MG tablet, Take 1 tablet (20 mg total) by mouth daily., Disp: 30 tablet, Rfl: 3 .  diclofenac (VOLTAREN) 75 MG EC tablet, Take 1 tablet (75 mg total) by mouth 2 (two) times daily as needed., Disp: 60 tablet, Rfl: 1   Review of Systems  Constitutional: Positive for fatigue. Negative for appetite change, chills, diaphoresis, fever and unexpected weight change.  HENT: Positive for sneezing. Negative for congestion, dental problem, drooling, ear pain, facial swelling, hearing loss, mouth sores, sore throat, trouble swallowing and voice change.   Eyes: Positive for redness. Negative for pain, discharge, itching and visual disturbance.  Respiratory: Positive for cough and shortness of breath. Negative for choking and wheezing.   Cardiovascular: Negative for chest pain, palpitations and leg swelling.   Gastrointestinal: Negative for abdominal pain, blood in stool, constipation, diarrhea and vomiting.  Endocrine: Positive for heat intolerance and polydipsia. Negative for cold intolerance.  Genitourinary: Negative for decreased urine volume, dysuria and hematuria.  Musculoskeletal: Positive for arthralgias, back pain and gait problem.  Skin: Negative for rash.  Allergic/Immunologic: Negative for environmental allergies.  Neurological: Positive for headaches. Negative for seizures, syncope and light-headedness.  Hematological: Negative for adenopathy.  Psychiatric/Behavioral: Negative for agitation, dysphoric mood and suicidal ideas. The patient is not nervous/anxious.     Per HPI unless specifically indicated above     Objective:    BP 122/68 (BP Location: Left Arm, Patient Position: Sitting, Cuff Size: Large)   Pulse 96   Temp 98.1 F (36.7 C) (Other (Comment))   Ht 5' 4.25" (1.632 m)   Wt 251 lb (113.9 kg)   SpO2 96%   BMI 42.75 kg/m   Wt Readings from Last 3 Encounters:  06/26/17 251 lb (113.9 kg)  05/28/17 252 lb (114.3 kg)  05/24/17 253 lb (114.8 kg)    Physical Exam  Constitutional: She is oriented to person, place, and time. She appears well-developed and well-nourished.  HENT:  Head: Normocephalic and atraumatic.  Neck: Neck supple.  Cardiovascular: Normal rate and regular rhythm.  Pulmonary/Chest: Effort normal and breath sounds normal.  Abdominal: Soft. Bowel sounds are normal. She exhibits no mass. There is no hepatosplenomegaly. There is no tenderness.  Musculoskeletal: She exhibits no edema.  Lymphadenopathy:  She has no cervical adenopathy.  Neurological: She is alert and oriented to person, place, and time.  Skin: Skin is warm and dry.  Psychiatric: She has a normal mood and affect. Her behavior is normal.  Vitals reviewed.       Assessment & Plan:    Encounter Diagnoses  Name Primary?  . Chronic obstructive pulmonary disease, unspecified COPD  type (HCC) Yes  . Cigarette nicotine dependence with nicotine-induced disorder   . Hyperlipidemia, unspecified hyperlipidemia type   . Anxiety   . Depression, unspecified depression type   . Morbid obesity (HCC)   . Anemia, unspecified type     -refilled inhaler -Pt rminded to return iFOBT -counseled at length on chantix and smoking cessation.  Pt was given reading information on chantix and we will readdress at next OV -pt to follow up 6 weeks.  RTO sooner prn

## 2017-07-05 ENCOUNTER — Ambulatory Visit (INDEPENDENT_AMBULATORY_CARE_PROVIDER_SITE_OTHER): Payer: Self-pay | Admitting: Otolaryngology

## 2017-07-05 DIAGNOSIS — H6983 Other specified disorders of Eustachian tube, bilateral: Secondary | ICD-10-CM

## 2017-07-05 DIAGNOSIS — H903 Sensorineural hearing loss, bilateral: Secondary | ICD-10-CM

## 2017-08-10 ENCOUNTER — Other Ambulatory Visit (HOSPITAL_COMMUNITY)
Admission: RE | Admit: 2017-08-10 | Discharge: 2017-08-10 | Disposition: A | Discharge status: Home / Self Care | Payer: Self-pay | Source: Ambulatory Visit | Attending: Physician Assistant | Admitting: Physician Assistant

## 2017-08-10 DIAGNOSIS — D649 Anemia, unspecified: Secondary | ICD-10-CM | POA: Insufficient documentation

## 2017-08-10 DIAGNOSIS — E785 Hyperlipidemia, unspecified: Secondary | ICD-10-CM | POA: Insufficient documentation

## 2017-08-10 LAB — COMPREHENSIVE METABOLIC PANEL
ALT: 37 U/L (ref 14–54)
AST: 30 U/L (ref 15–41)
Albumin: 4 g/dL (ref 3.5–5.0)
Alkaline Phosphatase: 71 U/L (ref 38–126)
Anion gap: 8 (ref 5–15)
BUN: 16 mg/dL (ref 6–20)
CO2: 24 mmol/L (ref 22–32)
Calcium: 9 mg/dL (ref 8.9–10.3)
Chloride: 106 mmol/L (ref 101–111)
Creatinine, Ser: 0.81 mg/dL (ref 0.44–1.00)
GFR calc Af Amer: >60 mL/min (ref >60)
GFR calc non Af Amer: >60 mL/min (ref >60)
Glucose, Bld: 98 mg/dL (ref 65–99)
Potassium: 4 mmol/L (ref 3.5–5.1)
Sodium: 138 mmol/L (ref 135–145)
Total Bilirubin: 0.9 mg/dL (ref 0.3–1.2)
Total Protein: 7.4 g/dL (ref 6.5–8.1)

## 2017-08-10 LAB — LIPID PANEL
Cholesterol: 190 mg/dL (ref 0–200)
HDL: 32 mg/dL — ABNORMAL LOW (ref >40)
LDL Cholesterol: 125 mg/dL — ABNORMAL HIGH (ref 0–99)
Total CHOL/HDL Ratio: 5.9 RATIO
Triglycerides: 163 mg/dL — ABNORMAL HIGH (ref <150)
VLDL: 33 mg/dL (ref 0–40)

## 2017-08-10 LAB — HEMOGLOBIN AND HEMATOCRIT, BLOOD
HCT: 41.4 % (ref 36.0–46.0)
Hemoglobin: 13.6 g/dL (ref 12.0–15.0)

## 2017-08-14 ENCOUNTER — Ambulatory Visit: Payer: Self-pay | Admitting: Physician Assistant

## 2017-08-14 ENCOUNTER — Encounter: Payer: Self-pay | Admitting: Physician Assistant

## 2017-08-14 VITALS — BP 128/78 | HR 92 | Temp 98.1°F | Ht 64.25 in | Wt 251.0 lb

## 2017-08-14 DIAGNOSIS — F32A Depression, unspecified: Secondary | ICD-10-CM

## 2017-08-14 DIAGNOSIS — F419 Anxiety disorder, unspecified: Secondary | ICD-10-CM

## 2017-08-14 DIAGNOSIS — Z1239 Encounter for other screening for malignant neoplasm of breast: Secondary | ICD-10-CM

## 2017-08-14 DIAGNOSIS — F17219 Nicotine dependence, cigarettes, with unspecified nicotine-induced disorders: Secondary | ICD-10-CM

## 2017-08-14 DIAGNOSIS — J449 Chronic obstructive pulmonary disease, unspecified: Secondary | ICD-10-CM

## 2017-08-14 DIAGNOSIS — E785 Hyperlipidemia, unspecified: Secondary | ICD-10-CM

## 2017-08-14 DIAGNOSIS — F329 Major depressive disorder, single episode, unspecified: Secondary | ICD-10-CM

## 2017-08-14 MED ORDER — VARENICLINE TARTRATE 1 MG PO TABS: 1.0000 mg | ORAL_TABLET | Freq: Two times a day (BID) | ORAL | 0 refills | Status: DC

## 2017-08-14 MED ORDER — CITALOPRAM HYDROBROMIDE 20 MG PO TABS: 20.0000 mg | ORAL_TABLET | Freq: Every day | ORAL | 3 refills | Status: DC

## 2017-08-14 MED ORDER — ATORVASTATIN CALCIUM 20 MG PO TABS
20.0000 mg | ORAL_TABLET | Freq: Every day | ORAL | 2 refills | Status: DC
Start: 2017-08-14 — End: 2018-02-19

## 2017-08-14 MED ORDER — VARENICLINE TARTRATE 0.5 MG X 11 & 1 MG X 42 PO MISC: ORAL | 0 refills | Status: DC

## 2017-08-14 NOTE — Progress Notes (Signed)
BP 128/78 (BP Location: Right Arm, Patient Position: Sitting, Cuff Size: Normal)   Pulse 92   Temp 98.1 F (36.7 C) (Other (Comment))   Ht 5' 4.25" (1.632 m)   Wt 251 lb (113.9 kg)   SpO2 95%   BMI 42.75 kg/m    Subjective:    Patient ID: Lori Calderon, female    DOB: [birth year], 52 y.o.   MRN: [id number]  HPI: Lori Calderon is a 52 y.o. female presenting on 08/14/2017 for Hyperlipidemia and Nicotine Dependence   HPI   Pt out of lipitor about a week.  She also out of citalopram.  She can tell a change and wants to get back on it.   Pt says she read all of the information she was given on chantix and she has researched it and wants to go on it to help her stop smoking.   Relevant past medical, surgical, family and social history reviewed and updated as indicated. Interim medical history since our last visit reviewed. Allergies and medications reviewed and updated.   Current Outpatient Medications:  .  albuterol (PROVENTIL HFA;VENTOLIN HFA) 108 (90 Base) MCG/ACT inhaler, Inhale 2 puffs into the lungs every 6 (six) hours as needed for wheezing or shortness of breath., Disp: 3 Inhaler, Rfl: 1 .  atorvastatin (LIPITOR) 20 MG tablet, Take 1 tablet (20 mg total) by mouth daily. (Patient not taking: Reported on 08/14/2017), Disp: 90 tablet, Rfl: 2 .  citalopram (CELEXA) 20 MG tablet, Take 1 tablet (20 mg total) by mouth daily. (Patient not taking: Reported on 08/14/2017), Disp: 30 tablet, Rfl: 3 .  diclofenac (VOLTAREN) 75 MG EC tablet, Take 1 tablet (75 mg total) by mouth 2 (two) times daily as needed. (Patient not taking: Reported on 08/14/2017), Disp: 60 tablet, Rfl: 1   Review of Systems  Constitutional: Positive for fatigue. Negative for appetite change, chills, diaphoresis, fever and unexpected weight change.  HENT: Negative for congestion, drooling, ear pain, facial swelling, hearing loss, mouth sores, sneezing, sore throat, trouble swallowing and voice change.   Eyes: Positive  for redness and visual disturbance. Negative for pain, discharge and itching.  Respiratory: Positive for shortness of breath. Negative for cough, choking and wheezing.   Cardiovascular: Negative for chest pain, palpitations and leg swelling.  Gastrointestinal: Negative for abdominal pain, blood in stool, constipation, diarrhea and vomiting.  Endocrine: Positive for heat intolerance and polydipsia. Negative for cold intolerance.  Genitourinary: Negative for decreased urine volume, dysuria and hematuria.  Musculoskeletal: Positive for arthralgias, back pain and gait problem.  Skin: Negative for rash.  Allergic/Immunologic: Positive for environmental allergies.  Neurological: Positive for headaches. Negative for seizures, syncope and light-headedness.  Hematological: Negative for adenopathy.  Psychiatric/Behavioral: Positive for agitation and dysphoric mood. Negative for suicidal ideas. The patient is nervous/anxious.     Per HPI unless specifically indicated above     Objective:    BP 128/78 (BP Location: Right Arm, Patient Position: Sitting, Cuff Size: Normal)   Pulse 92   Temp 98.1 F (36.7 C) (Other (Comment))   Ht 5' 4.25" (1.632 m)   Wt 251 lb (113.9 kg)   SpO2 95%   BMI 42.75 kg/m   Wt Readings from Last 3 Encounters:  08/14/17 251 lb (113.9 kg)  06/26/17 251 lb (113.9 kg)  05/28/17 252 lb (114.3 kg)    Physical Exam  Constitutional: She is oriented to person, place, and time. She appears well-developed and well-nourished.  HENT:  Head: Normocephalic and atraumatic.  Neck: Neck supple.  Cardiovascular: Normal rate and regular rhythm.  Pulmonary/Chest: Effort normal and breath sounds normal.  Abdominal: Soft. Bowel sounds are normal. She exhibits no mass. There is no hepatosplenomegaly. There is no tenderness.  Musculoskeletal: She exhibits no edema.  Lymphadenopathy:    She has no cervical adenopathy.  Neurological: She is alert and oriented to person, place, and time.   Skin: Skin is warm and dry.  Psychiatric: She has a normal mood and affect. Her behavior is normal.  Vitals reviewed.   Results for orders placed or performed during the hospital encounter of 08/10/17  Lipid panel  Result Value Ref Range   Cholesterol 190 0 - 200 mg/dL   Triglycerides 161 (H) <150 mg/dL   HDL 32 (L) >09 mg/dL   Total CHOL/HDL Ratio 5.9 RATIO   VLDL 33 0 - 40 mg/dL   LDL Cholesterol 604 (H) 0 - 99 mg/dL  Hemoglobin and hematocrit, blood  Result Value Ref Range   Hemoglobin 13.6 12.0 - 15.0 g/dL   HCT 54.0 98.1 - 19.1 %  Comprehensive metabolic panel  Result Value Ref Range   Sodium 138 135 - 145 mmol/L   Potassium 4.0 3.5 - 5.1 mmol/L   Chloride 106 101 - 111 mmol/L   CO2 24 22 - 32 mmol/L   Glucose, Bld 98 65 - 99 mg/dL   BUN 16 6 - 20 mg/dL   Creatinine, Ser 4.78 0.44 - 1.00 mg/dL   Calcium 9.0 8.9 - 29.5 mg/dL   Total Protein 7.4 6.5 - 8.1 g/dL   Albumin 4.0 3.5 - 5.0 g/dL   AST 30 15 - 41 U/L   ALT 37 14 - 54 U/L   Alkaline Phosphatase 71 38 - 126 U/L   Total Bilirubin 0.9 0.3 - 1.2 mg/dL   GFR calc non Af Amer >60 >60 mL/min   GFR calc Af Amer >60 >60 mL/min   Anion gap 8 5 - 15      Assessment & Plan:    Encounter Diagnoses  Name Primary?  . Chronic obstructive pulmonary disease, unspecified COPD type (HCC) Yes  . Cigarette nicotine dependence with nicotine-induced disorder   . Hyperlipidemia, unspecified hyperlipidemia type   . Anxiety   . Depression, unspecified depression type   . Morbid obesity (HCC)   . Screening for breast cancer     -reviewed labs with pt -ordered screening mammogram -after again discussion of chantix,pt will proceed.  Pt to notify us immediatly for any mental health changes and if that happens she should immediately stop the chantix.  She states understanding -pt to follow up 3 months.  RTO sooner prn

## 2017-08-21 ENCOUNTER — Other Ambulatory Visit: Payer: Self-pay | Admitting: Physician Assistant

## 2017-08-21 DIAGNOSIS — Z1231 Encounter for screening mammogram for malignant neoplasm of breast: Secondary | ICD-10-CM

## 2017-09-20 ENCOUNTER — Ambulatory Visit: Payer: Self-pay | Admitting: Physician Assistant

## 2017-09-20 ENCOUNTER — Encounter: Payer: Self-pay | Admitting: Physician Assistant

## 2017-09-20 VITALS — BP 140/68 | HR 93 | Temp 97.5°F | Ht 64.25 in | Wt 252.0 lb

## 2017-09-20 DIAGNOSIS — F17219 Nicotine dependence, cigarettes, with unspecified nicotine-induced disorders: Secondary | ICD-10-CM

## 2017-09-20 DIAGNOSIS — M545 Low back pain, unspecified: Secondary | ICD-10-CM

## 2017-09-20 DIAGNOSIS — J449 Chronic obstructive pulmonary disease, unspecified: Secondary | ICD-10-CM

## 2017-09-20 DIAGNOSIS — R0600 Dyspnea, unspecified: Secondary | ICD-10-CM

## 2017-09-20 DIAGNOSIS — R0602 Shortness of breath: Secondary | ICD-10-CM

## 2017-09-20 NOTE — Progress Notes (Signed)
BP 140/68 (BP Location: Left Arm, Patient Position: Sitting, Cuff Size: Normal)   Pulse 93   Temp (!) 97.5 F (36.4 C)   Ht 5' 4.25" (1.632 m)   Wt 252 lb (114.3 kg)   SpO2 97%   BMI 42.92 kg/m    Subjective:    Patient ID: Lori Calderon, female    DOB: 01-19-1966, 52 y.o.   MRN: [id number]  HPI: Lori Calderon is a 52 y.o. female presenting on 09/20/2017 for Shortness of Breath   HPI  Chief Complaint  Patient presents with  . Shortness of Breath    Pt started with nighttime trouble breathing on Sunday night.    She has had problems with breathing at work because there is no air conditioning (she works at [employer])  sunday she woke up - she had been holding her breath and had SOB.  She breathes fine when at home in the a/c during the day.  She is still continuing to awaken during the night with sob.   Pt was originally referred to orthopedics in May 2018 and again re-referred to orthopedics in March 2019 but was unable to go.  She says she called recently to make appointment but was told the referral would need to be re-entered.   She had MRI of the lumbar spine April 2018.   Relevant past medical, surgical, family and social history reviewed and updated as indicated. Interim medical history since our last visit reviewed. Allergies and medications reviewed and updated.    Current Outpatient Medications:  .  albuterol (PROVENTIL HFA;VENTOLIN HFA) 108 (90 Base) MCG/ACT inhaler, Inhale 2 puffs into the lungs every 6 (six) hours as needed for wheezing or shortness of breath., Disp: 3 Inhaler, Rfl: 1 .  atorvastatin (LIPITOR) 20 MG tablet, Take 1 tablet (20 mg total) by mouth daily., Disp: 90 tablet, Rfl: 2 .  citalopram (CELEXA) 20 MG tablet, Take 1 tablet (20 mg total) by mouth daily., Disp: 30 tablet, Rfl: 3 .  diclofenac (VOLTAREN) 75 MG EC tablet, Take 1 tablet (75 mg total) by mouth 2 (two) times daily as needed., Disp: 60 tablet, Rfl: 1 .  varenicline (CHANTIX  STARTING MONTH PAK) 0.5 MG X 11 & 1 MG X 42 tablet, Take one 0.5 mg tablet PO once daily for 3 days, then increase to one 0.5 mg tab bid for 4 days, then increase to one 1 mg tab bid, Disp: 53 tablet, Rfl: 0 .  varenicline (CHANTIX CONTINUING MONTH PAK) 1 MG tablet, Take 1 tablet (1 mg total) by mouth 2 (two) times daily. (Patient not taking: Reported on 09/20/2017), Disp: 112 tablet, Rfl: 0  Review of Systems  Constitutional: Positive for diaphoresis and fatigue. Negative for appetite change, chills, fever and unexpected weight change.  HENT: Negative for congestion, drooling, ear pain, facial swelling, hearing loss, mouth sores, sneezing, sore throat, trouble swallowing and voice change.   Eyes: Positive for redness. Negative for pain, discharge, itching and visual disturbance.  Respiratory: Positive for cough and shortness of breath. Negative for choking and wheezing.   Cardiovascular: Negative for chest pain, palpitations and leg swelling.  Gastrointestinal: Negative for abdominal pain, blood in stool, constipation, diarrhea and vomiting.  Endocrine: Positive for heat intolerance and polydipsia. Negative for cold intolerance.  Genitourinary: Negative for decreased urine volume, dysuria and hematuria.  Musculoskeletal: Positive for arthralgias, back pain and gait problem.  Skin: Positive for rash.  Allergic/Immunologic: Negative for environmental allergies.  Neurological: Positive for light-headedness and  headaches. Negative for seizures and syncope.  Hematological: Negative for adenopathy.  Psychiatric/Behavioral: Positive for dysphoric mood. Negative for agitation and suicidal ideas. The patient is nervous/anxious.     Per HPI unless specifically indicated above     Objective:    BP 140/68 (BP Location: Left Arm, Patient Position: Sitting, Cuff Size: Normal)   Pulse 93   Temp (!) 97.5 F (36.4 C)   Ht 5' 4.25" (1.632 m)   Wt 252 lb (114.3 kg)   SpO2 97%   BMI 42.92 kg/m   Wt  Readings from Last 3 Encounters:  09/20/17 252 lb (114.3 kg)  08/14/17 251 lb (113.9 kg)  06/26/17 251 lb (113.9 kg)    Physical Exam  Constitutional: She is oriented to person, place, and time. She appears well-developed and well-nourished.  HENT:  Head: Normocephalic and atraumatic.  Neck: Neck supple.  Cardiovascular: Normal rate and regular rhythm.  Pulmonary/Chest: Effort normal and breath sounds normal.  Abdominal: Soft. Bowel sounds are normal. She exhibits no mass. There is no hepatosplenomegaly. There is no tenderness.  Musculoskeletal: She exhibits no edema.  Lymphadenopathy:    She has no cervical adenopathy.  Neurological: She is alert and oriented to person, place, and time.  Skin: Skin is warm and dry.  Psychiatric: She has a normal mood and affect. Her behavior is normal.  Vitals reviewed.       Assessment & Plan:   Encounter Diagnoses  Name Primary?  . SOB (shortness of breath) Yes  . Nocturnal dyspnea   . Cigarette nicotine dependence with nicotine-induced disorder   . Chronic obstructive pulmonary disease, unspecified COPD type (HCC)   . Low back pain without sciatica, unspecified back pain laterality, unspecified chronicity     -Order OSA test -Pt was given another [facility] application and number for finanial counselor -Re-refer to orthopedics -counseled smoking cessation -Follow up as scheduled

## 2017-09-20 NOTE — Patient Instructions (Signed)
Financial counselor- [phone]   

## 2017-11-14 ENCOUNTER — Ambulatory Visit: Payer: Self-pay | Admitting: Physician Assistant

## 2017-11-19 ENCOUNTER — Other Ambulatory Visit (HOSPITAL_COMMUNITY)
Admission: RE | Admit: 2017-11-19 | Discharge: 2017-11-19 | Disposition: A | Discharge status: Home / Self Care | Payer: Self-pay | Source: Ambulatory Visit | Attending: Physician Assistant | Admitting: Physician Assistant

## 2017-11-19 DIAGNOSIS — E785 Hyperlipidemia, unspecified: Secondary | ICD-10-CM | POA: Insufficient documentation

## 2017-11-19 LAB — COMPREHENSIVE METABOLIC PANEL
ALT: 34 U/L (ref 0–44)
AST: 27 U/L (ref 15–41)
Albumin: 4.1 g/dL (ref 3.5–5.0)
Alkaline Phosphatase: 72 U/L (ref 38–126)
Anion gap: 9 (ref 5–15)
BUN: 15 mg/dL (ref 6–20)
CO2: 28 mmol/L (ref 22–32)
Calcium: 9.1 mg/dL (ref 8.9–10.3)
Chloride: 104 mmol/L (ref 98–111)
Creatinine, Ser: 0.88 mg/dL (ref 0.44–1.00)
GFR calc Af Amer: >60 mL/min (ref >60)
GFR calc non Af Amer: >60 mL/min (ref >60)
Glucose, Bld: 101 mg/dL — ABNORMAL HIGH (ref 70–99)
Potassium: 4.4 mmol/L (ref 3.5–5.1)
Sodium: 141 mmol/L (ref 135–145)
Total Bilirubin: 0.9 mg/dL (ref 0.3–1.2)
Total Protein: 7.6 g/dL (ref 6.5–8.1)

## 2017-11-19 LAB — LIPID PANEL
Cholesterol: 191 mg/dL (ref 0–200)
HDL: 32 mg/dL — ABNORMAL LOW (ref >40)
LDL Cholesterol: 119 mg/dL — ABNORMAL HIGH (ref 0–99)
Total CHOL/HDL Ratio: 6 RATIO
Triglycerides: 200 mg/dL — ABNORMAL HIGH (ref <150)
VLDL: 40 mg/dL (ref 0–40)

## 2017-11-21 ENCOUNTER — Encounter: Payer: Self-pay | Admitting: Physician Assistant

## 2017-11-21 ENCOUNTER — Ambulatory Visit: Payer: Self-pay | Admitting: Physician Assistant

## 2017-11-21 VITALS — BP 122/74 | HR 71 | Temp 97.7°F | Wt 253.0 lb

## 2017-11-21 DIAGNOSIS — D649 Anemia, unspecified: Secondary | ICD-10-CM

## 2017-11-21 DIAGNOSIS — F17219 Nicotine dependence, cigarettes, with unspecified nicotine-induced disorders: Secondary | ICD-10-CM

## 2017-11-21 DIAGNOSIS — J449 Chronic obstructive pulmonary disease, unspecified: Secondary | ICD-10-CM

## 2017-11-21 DIAGNOSIS — F32A Depression, unspecified: Secondary | ICD-10-CM

## 2017-11-21 DIAGNOSIS — E785 Hyperlipidemia, unspecified: Secondary | ICD-10-CM

## 2017-11-21 DIAGNOSIS — F329 Major depressive disorder, single episode, unspecified: Secondary | ICD-10-CM

## 2017-11-21 DIAGNOSIS — F419 Anxiety disorder, unspecified: Secondary | ICD-10-CM

## 2017-11-21 MED ORDER — MOMETASONE FURO-FORMOTEROL FUM 100-5 MCG/ACT IN AERO: 2.0000 | INHALATION_SPRAY | Freq: Two times a day (BID) | RESPIRATORY_TRACT | 1 refills | Status: DC

## 2017-11-21 NOTE — Patient Instructions (Addendum)
Financial counselor - [phone]

## 2017-11-21 NOTE — Progress Notes (Signed)
BP 122/74 (BP Location: Right Arm, Patient Position: Sitting, Cuff Size: Normal)   Pulse 71   Temp 97.7 F (36.5 C)   Wt 253 lb (114.8 kg)   SpO2 94%   BMI 43.09 kg/m    Subjective:    Patient ID: Lori Calderon, female    DOB: [birth year], 52 y.o.   MRN: [id number]  HPI: Lori Calderon is a 52 y.o. female presenting on 11/21/2017 for Hyperlipidemia   HPI  Pt took the chantix for a while but it was making her very nauseated and was interfering with her sleep so she stopped it.   Her breathing is okay but not great.  She is using her inhaler 3 or 4 days/week.   She is still smoking.    Relevant past medical, surgical, family and social history reviewed and updated as indicated. Interim medical history since our last visit reviewed. Allergies and medications reviewed and updated.   Current Outpatient Medications:  .  albuterol (PROVENTIL HFA;VENTOLIN HFA) 108 (90 Base) MCG/ACT inhaler, Inhale 2 puffs into the lungs every 6 (six) hours as needed for wheezing or shortness of breath., Disp: 3 Inhaler, Rfl: 1 .  atorvastatin (LIPITOR) 20 MG tablet, Take 1 tablet (20 mg total) by mouth daily., Disp: 90 tablet, Rfl: 2 .  citalopram (CELEXA) 20 MG tablet, Take 1 tablet (20 mg total) by mouth daily., Disp: 30 tablet, Rfl: 3 .  diclofenac (VOLTAREN) 75 MG EC tablet, Take 1 tablet (75 mg total) by mouth 2 (two) times daily as needed., Disp: 60 tablet, Rfl: 1   Review of Systems  Constitutional: Positive for diaphoresis and fatigue. Negative for appetite change, chills, fever and unexpected weight change.  HENT: Negative for congestion, dental problem, drooling, ear pain, facial swelling, hearing loss, mouth sores, sneezing, sore throat, trouble swallowing and voice change.   Eyes: Positive for pain and visual disturbance. Negative for discharge, redness and itching.  Respiratory: Positive for cough and shortness of breath. Negative for choking and wheezing.   Cardiovascular: Negative  for chest pain, palpitations and leg swelling.  Gastrointestinal: Negative for abdominal pain, blood in stool, constipation, diarrhea and vomiting.  Endocrine: Positive for heat intolerance and polydipsia. Negative for cold intolerance.  Genitourinary: Negative for decreased urine volume, dysuria and hematuria.  Musculoskeletal: Positive for arthralgias, back pain and gait problem.  Skin: Negative for rash.  Allergic/Immunologic: Negative for environmental allergies.  Neurological: Positive for headaches. Negative for seizures, syncope and light-headedness.  Hematological: Negative for adenopathy.  Psychiatric/Behavioral: Negative for agitation, dysphoric mood and suicidal ideas. The patient is not nervous/anxious.     Per HPI unless specifically indicated above     Objective:    BP 122/74 (BP Location: Right Arm, Patient Position: Sitting, Cuff Size: Normal)   Pulse 71   Temp 97.7 F (36.5 C)   Wt 253 lb (114.8 kg)   SpO2 94%   BMI 43.09 kg/m   Wt Readings from Last 3 Encounters:  11/21/17 253 lb (114.8 kg)  09/20/17 252 lb (114.3 kg)  08/14/17 251 lb (113.9 kg)    Physical Exam  Constitutional: She is oriented to person, place, and time. She appears well-developed and well-nourished.  HENT:  Head: Normocephalic and atraumatic.  Neck: Neck supple.  Cardiovascular: Normal rate and regular rhythm.  Pulmonary/Chest: Effort normal and breath sounds normal.  Abdominal: Soft. Bowel sounds are normal. She exhibits no mass. There is no hepatosplenomegaly. There is no tenderness.  Musculoskeletal: She exhibits no edema.  Lymphadenopathy:    She has no cervical adenopathy.  Neurological: She is alert and oriented to person, place, and time.  Skin: Skin is warm and dry.  Psychiatric: She has a normal mood and affect. Her behavior is normal.  Vitals reviewed.   Results for orders placed or performed during the hospital encounter of 11/19/17  Lipid panel  Result Value Ref Range    Cholesterol 191 0 - 200 mg/dL   Triglycerides 582 (H) <150 mg/dL   HDL 32 (L) >51 mg/dL   Total CHOL/HDL Ratio 6.0 RATIO   VLDL 40 0 - 40 mg/dL   LDL Cholesterol 898 (H) 0 - 99 mg/dL  Comprehensive metabolic panel  Result Value Ref Range   Sodium 141 135 - 145 mmol/L   Potassium 4.4 3.5 - 5.1 mmol/L   Chloride 104 98 - 111 mmol/L   CO2 28 22 - 32 mmol/L   Glucose, Bld 101 (H) 70 - 99 mg/dL   BUN 15 6 - 20 mg/dL   Creatinine, Ser 4.21 0.44 - 1.00 mg/dL   Calcium 9.1 8.9 - 03.1 mg/dL   Total Protein 7.6 6.5 - 8.1 g/dL   Albumin 4.1 3.5 - 5.0 g/dL   AST 27 15 - 41 U/L   ALT 34 0 - 44 U/L   Alkaline Phosphatase 72 38 - 126 U/L   Total Bilirubin 0.9 0.3 - 1.2 mg/dL   GFR calc non Af Amer >60 >60 mL/min   GFR calc Af Amer >60 >60 mL/min   Anion gap 9 5 - 15      Assessment & Plan:   Encounter Diagnoses  Name Primary?  . Chronic obstructive pulmonary disease, unspecified COPD type (HCC) Yes  . Hyperlipidemia, unspecified hyperlipidemia type   . Cigarette nicotine dependence with nicotine-induced disorder   . Anxiety   . Depression, unspecified depression type   . Morbid obesity (HCC)     -reviewed labs with pt -Add dulera.  Continue albuterol prn -counseled smoking cessation -pt to continue other medications -pt reminded to return iFOBT given in March -gave pt contact number to check on her [facility] (she was referred to orthopedics for LBP) -pt to follow up 3 months.  RTO sooner prn

## 2018-02-18 ENCOUNTER — Other Ambulatory Visit (HOSPITAL_COMMUNITY)
Admission: RE | Admit: 2018-02-18 | Discharge: 2018-02-18 | Disposition: A | Discharge status: Home / Self Care | Payer: Self-pay | Source: Ambulatory Visit | Attending: Physician Assistant | Admitting: Physician Assistant

## 2018-02-18 DIAGNOSIS — D649 Anemia, unspecified: Secondary | ICD-10-CM | POA: Insufficient documentation

## 2018-02-18 DIAGNOSIS — E785 Hyperlipidemia, unspecified: Secondary | ICD-10-CM | POA: Insufficient documentation

## 2018-02-18 LAB — COMPREHENSIVE METABOLIC PANEL
ALT: 28 U/L (ref 0–44)
AST: 25 U/L (ref 15–41)
Albumin: 4 g/dL (ref 3.5–5.0)
Alkaline Phosphatase: 78 U/L (ref 38–126)
Anion gap: 6 (ref 5–15)
BUN: 16 mg/dL (ref 6–20)
CO2: 27 mmol/L (ref 22–32)
Calcium: 8.9 mg/dL (ref 8.9–10.3)
Chloride: 104 mmol/L (ref 98–111)
Creatinine, Ser: 0.89 mg/dL (ref 0.44–1.00)
GFR calc Af Amer: >60 mL/min (ref >60)
GFR calc non Af Amer: >60 mL/min (ref >60)
Glucose, Bld: 105 mg/dL — ABNORMAL HIGH (ref 70–99)
Potassium: 4.5 mmol/L (ref 3.5–5.1)
Sodium: 137 mmol/L (ref 135–145)
Total Bilirubin: 0.7 mg/dL (ref 0.3–1.2)
Total Protein: 7.5 g/dL (ref 6.5–8.1)

## 2018-02-18 LAB — LIPID PANEL
Cholesterol: 203 mg/dL — ABNORMAL HIGH (ref 0–200)
HDL: 35 mg/dL — ABNORMAL LOW (ref >40)
LDL Cholesterol: 139 mg/dL — ABNORMAL HIGH (ref 0–99)
Total CHOL/HDL Ratio: 5.8 RATIO
Triglycerides: 146 mg/dL (ref <150)
VLDL: 29 mg/dL (ref 0–40)

## 2018-02-18 LAB — HEMOGLOBIN AND HEMATOCRIT, BLOOD
HCT: 43.4 % (ref 36.0–46.0)
Hemoglobin: 14 g/dL (ref 12.0–15.0)

## 2018-02-19 ENCOUNTER — Encounter: Payer: Self-pay | Admitting: Physician Assistant

## 2018-02-19 ENCOUNTER — Ambulatory Visit: Payer: Self-pay | Admitting: Physician Assistant

## 2018-02-19 VITALS — BP 128/82 | HR 82 | Temp 97.3°F

## 2018-02-19 DIAGNOSIS — F17200 Nicotine dependence, unspecified, uncomplicated: Secondary | ICD-10-CM

## 2018-02-19 DIAGNOSIS — F419 Anxiety disorder, unspecified: Secondary | ICD-10-CM

## 2018-02-19 DIAGNOSIS — M545 Low back pain, unspecified: Secondary | ICD-10-CM

## 2018-02-19 DIAGNOSIS — F32A Depression, unspecified: Secondary | ICD-10-CM

## 2018-02-19 DIAGNOSIS — F329 Major depressive disorder, single episode, unspecified: Secondary | ICD-10-CM

## 2018-02-19 DIAGNOSIS — J449 Chronic obstructive pulmonary disease, unspecified: Secondary | ICD-10-CM

## 2018-02-19 DIAGNOSIS — R937 Abnormal findings on diagnostic imaging of other parts of musculoskeletal system: Secondary | ICD-10-CM

## 2018-02-19 DIAGNOSIS — E785 Hyperlipidemia, unspecified: Secondary | ICD-10-CM

## 2018-02-19 DIAGNOSIS — R413 Other amnesia: Secondary | ICD-10-CM

## 2018-02-19 DIAGNOSIS — Z1239 Encounter for other screening for malignant neoplasm of breast: Secondary | ICD-10-CM

## 2018-02-19 MED ORDER — ATORVASTATIN CALCIUM 20 MG PO TABS: 20.0000 mg | ORAL_TABLET | Freq: Every day | ORAL | 2 refills | Status: DC

## 2018-02-19 MED ORDER — CITALOPRAM HYDROBROMIDE 20 MG PO TABS: 20.0000 mg | ORAL_TABLET | Freq: Every day | ORAL | 3 refills | Status: DC

## 2018-02-19 MED ORDER — MOMETASONE FURO-FORMOTEROL FUM 100-5 MCG/ACT IN AERO: 2.0000 | INHALATION_SPRAY | Freq: Two times a day (BID) | RESPIRATORY_TRACT | 1 refills | Status: DC

## 2018-02-19 MED ORDER — ALBUTEROL SULFATE HFA 108 (90 BASE) MCG/ACT IN AERS: 2.0000 | INHALATION_SPRAY | Freq: Four times a day (QID) | RESPIRATORY_TRACT | 1 refills | Status: DC | PRN

## 2018-02-19 NOTE — Progress Notes (Signed)
BP 128/82   Pulse 82   Temp (!) 97.3 F (36.3 C)   SpO2 96%    Subjective:    Patient ID: Lori Calderon, female    DOB: [birth year], 52 y.o.   MRN: [id number]  HPI: Lori Calderon is a 52 y.o. female presenting on 02/19/2018 for COPD and Hyperlipidemia   HPI   Pt's mother is with her today.  Pt is still working at [employer].  She is still smoking.    She says her back is still bothering her a lot.  She was sent for MRI last year and referred to specialist but she didn't see specialist because she never got her paperwork submitted for [facility].   She says she would like to be referred again because the back is bothering her worse.    Pt and her mother also states that her Memory loss is seeming to be getting worse.  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    Pt says she never received the dulera that was ordered for her.  Pt has been off her lipitor for about 3 weeks.  She says she has difficulty remembering to take it  Pt and mother are in agreement that the citalopram helps a lot with pt's mood  Relevant past medical, surgical, family and social history reviewed and updated as indicated. Interim medical history since our last visit reviewed. Allergies and medications reviewed and updated.   Current Outpatient Medications:  .  albuterol (PROVENTIL HFA;VENTOLIN HFA) 108 (90 Base) MCG/ACT inhaler, Inhale 2 puffs into the lungs every 6 (six) hours as needed for wheezing or shortness of breath., Disp: 3 Inhaler, Rfl: 1 .  citalopram (CELEXA) 20 MG tablet, Take 1 tablet (20 mg total) by mouth daily., Disp: 30 tablet, Rfl: 3 .  diclofenac (VOLTAREN) 75 MG EC tablet, Take 1 tablet (75 mg total) by mouth 2 (two) times daily as needed., Disp: 60 tablet, Rfl: 1 .  atorvastatin (LIPITOR) 20 MG tablet, Take 1 tablet (20 mg total) by mouth daily., Disp: 90 tablet, Rfl: 2 .   mometasone-formoterol (DULERA) 100-5 MCG/ACT AERO, Inhale 2 puffs into the lungs 2 (two) times daily., Disp: 3 Inhaler, Rfl: 1    Review of Systems  Constitutional: Positive for appetite change and fatigue. Negative for chills, diaphoresis, fever and unexpected weight change.  HENT: Negative for congestion, dental problem, drooling, ear pain, facial swelling, hearing loss, mouth sores, sneezing, sore throat, trouble swallowing and voice change.   Eyes: Negative for pain, discharge, redness, itching and visual disturbance.  Respiratory: Positive for cough and shortness of breath. Negative for choking and wheezing.   Cardiovascular: Negative for chest pain, palpitations and leg swelling.  Gastrointestinal: Negative for abdominal pain, blood in stool, constipation, diarrhea and vomiting.  Endocrine: Positive for cold intolerance, heat intolerance and polydipsia.  Genitourinary: Negative for decreased urine volume, dysuria and hematuria.  Musculoskeletal: Positive for arthralgias, back pain and gait problem.  Skin: Negative for rash.  Allergic/Immunologic: Negative for environmental allergies.  Neurological: Positive for headaches. Negative for seizures, syncope and light-headedness.  Hematological: Negative for adenopathy.  Psychiatric/Behavioral: Positive for agitation and dysphoric mood. Negative for suicidal ideas. The patient is nervous/anxious.     Per HPI unless specifically indicated above     Objective:    BP 128/82   Pulse 82   Temp (!)  97.3 F (36.3 C)   SpO2 96%   Wt Readings from Last 3 Encounters:  11/21/17 253 lb (114.8 kg)  09/20/17 252 lb (114.3 kg)  08/14/17 251 lb (113.9 kg)    Physical Exam  Constitutional: She is oriented to person, place, and time. She appears well-developed and well-nourished.  HENT:  Head: Normocephalic and atraumatic.  Neck: Neck supple.  Cardiovascular: Normal rate and regular rhythm.  Pulmonary/Chest: Effort normal and breath sounds  normal.  Abdominal: Soft. Bowel sounds are normal. She exhibits no mass. There is no hepatosplenomegaly. There is no tenderness.  Musculoskeletal: She exhibits no edema.  Lymphadenopathy:    She has no cervical adenopathy.  Neurological: She is alert and oriented to person, place, and time.  Skin: Skin is warm and dry.  Psychiatric: She has a normal mood and affect. Her behavior is normal.  Vitals reviewed.   Results for orders placed or performed during the hospital encounter of 02/18/18  Hemoglobin and hematocrit, blood  Result Value Ref Range   Hemoglobin 14.0 12.0 - 15.0 g/dL   HCT 16.1 09.6 - 04.5 %  Lipid panel  Result Value Ref Range   Cholesterol 203 (H) 0 - 200 mg/dL   Triglycerides 409 <811 mg/dL   HDL 35 (L) >91 mg/dL   Total CHOL/HDL Ratio 5.8 RATIO   VLDL 29 0 - 40 mg/dL   LDL Cholesterol 478 (H) 0 - 99 mg/dL  Comprehensive metabolic panel  Result Value Ref Range   Sodium 137 135 - 145 mmol/L   Potassium 4.5 3.5 - 5.1 mmol/L   Chloride 104 98 - 111 mmol/L   CO2 27 22 - 32 mmol/L   Glucose, Bld 105 (H) 70 - 99 mg/dL   BUN 16 6 - 20 mg/dL   Creatinine, Ser 2.95 0.44 - 1.00 mg/dL   Calcium 8.9 8.9 - 62.1 mg/dL   Total Protein 7.5 6.5 - 8.1 g/dL   Albumin 4.0 3.5 - 5.0 g/dL   AST 25 15 - 41 U/L   ALT 28 0 - 44 U/L   Alkaline Phosphatase 78 38 - 126 U/L   Total Bilirubin 0.7 0.3 - 1.2 mg/dL   GFR calc non Af Amer >60 >60 mL/min   GFR calc Af Amer >60 >60 mL/min   Anion gap 6 5 - 15      Assessment & Plan:   Encounter Diagnoses  Name Primary?  . Chronic obstructive pulmonary disease, unspecified COPD type (HCC) Yes  . Low back pain without sciatica, unspecified back pain laterality, unspecified chronicity   . Memory loss   . Tobacco use disorder   . Screening for breast cancer   . Abnormal MRI, lumbar spine   . Hyperlipidemia, unspecified hyperlipidemia type   . Depression, unspecified depression type   . Anxiety      -will check into potential  treatments for memory issues due to phenobarbital -ordered screening Mammogram -Discussed referral to orthopedics in  and pt says she is ready to fill out her charity care application so she can be seen -pt counseled to Get back on lipitor and watch lowfat diet -pt encouraged to stop smoking in order to improve breathing.  Will reorder the dulera.  Pt to notify office if she doesn't receive it.  Albuterol MDI prn -continue citalopram for depression and anxiety -awaiting return of iFOBT given March 2019 for colon cancer screening -pt to follow up 3 months.  RTO sooner prn

## 2018-02-20 ENCOUNTER — Ambulatory Visit: Payer: Self-pay | Admitting: Physician Assistant

## 2018-02-20 ENCOUNTER — Telehealth: Payer: Self-pay | Admitting: Student

## 2018-02-20 NOTE — Telephone Encounter (Signed)
-----   Message from Jacquelin HawkingShannon McElroy, [place] sent at 02/19/2018  8:40 PM EST ----- Please let pt know that I inquired with neurologist to determine if there was potentially anything to be done to help memory problems related to phenobarbital and he said no.  Please put phone note in her chart. thanks

## 2018-02-21 ENCOUNTER — Ambulatory Visit (HOSPITAL_COMMUNITY)
Admission: RE | Admit: 2018-02-21 | Discharge: 2018-02-21 | Disposition: A | Discharge status: Home / Self Care | Payer: Self-pay | Source: Ambulatory Visit | Attending: Physician Assistant | Admitting: Physician Assistant

## 2018-02-21 DIAGNOSIS — M545 Low back pain: Secondary | ICD-10-CM | POA: Insufficient documentation

## 2018-02-26 ENCOUNTER — Telehealth (HOSPITAL_COMMUNITY): Payer: Self-pay | Admitting: Obstetrics and Gynecology

## 2018-02-26 NOTE — Telephone Encounter (Signed)
Called patient and left voicemail message regarding scheduling screening mammmogram.

## 2018-04-10 ENCOUNTER — Telehealth: Payer: Self-pay | Admitting: *Deleted

## 2018-04-10 NOTE — Telephone Encounter (Signed)
Telephoned patient, left a message to return a call to [facility], regarding mammo scholarship.

## 2018-05-13 ENCOUNTER — Other Ambulatory Visit: Payer: Self-pay | Admitting: Physician Assistant

## 2018-05-13 DIAGNOSIS — Z1239 Encounter for other screening for malignant neoplasm of breast: Secondary | ICD-10-CM

## 2018-05-20 ENCOUNTER — Other Ambulatory Visit (HOSPITAL_COMMUNITY)
Admission: RE | Admit: 2018-05-20 | Discharge: 2018-05-20 | Disposition: A | Discharge status: Home / Self Care | Payer: Self-pay | Source: Ambulatory Visit | Attending: Physician Assistant | Admitting: Physician Assistant

## 2018-05-20 ENCOUNTER — Other Ambulatory Visit: Payer: Self-pay

## 2018-05-20 DIAGNOSIS — E785 Hyperlipidemia, unspecified: Secondary | ICD-10-CM | POA: Insufficient documentation

## 2018-05-20 LAB — COMPREHENSIVE METABOLIC PANEL
ALT: 27 U/L (ref 0–44)
AST: 23 U/L (ref 15–41)
Albumin: 3.9 g/dL (ref 3.5–5.0)
Alkaline Phosphatase: 77 U/L (ref 38–126)
Anion gap: 9 (ref 5–15)
BUN: 14 mg/dL (ref 6–20)
CO2: 26 mmol/L (ref 22–32)
Calcium: 8.9 mg/dL (ref 8.9–10.3)
Chloride: 104 mmol/L (ref 98–111)
Creatinine, Ser: 0.78 mg/dL (ref 0.44–1.00)
GFR calc Af Amer: >60 mL/min (ref >60)
GFR calc non Af Amer: >60 mL/min (ref >60)
Glucose, Bld: 115 mg/dL — ABNORMAL HIGH (ref 70–99)
Potassium: 4.5 mmol/L (ref 3.5–5.1)
Sodium: 139 mmol/L (ref 135–145)
Total Bilirubin: 0.4 mg/dL (ref 0.3–1.2)
Total Protein: 7.2 g/dL (ref 6.5–8.1)

## 2018-05-20 LAB — LIPID PANEL
Cholesterol: 169 mg/dL (ref 0–200)
HDL: 37 mg/dL — ABNORMAL LOW (ref >40)
LDL Cholesterol: 101 mg/dL — ABNORMAL HIGH (ref 0–99)
Total CHOL/HDL Ratio: 4.6 RATIO
Triglycerides: 153 mg/dL — ABNORMAL HIGH (ref <150)
VLDL: 31 mg/dL (ref 0–40)

## 2018-05-21 ENCOUNTER — Ambulatory Visit: Payer: Self-pay | Admitting: Physician Assistant

## 2018-05-21 ENCOUNTER — Encounter: Payer: Self-pay | Admitting: Physician Assistant

## 2018-05-21 VITALS — BP 133/74 | HR 91 | Temp 97.7°F | Wt 250.5 lb

## 2018-05-21 DIAGNOSIS — E785 Hyperlipidemia, unspecified: Secondary | ICD-10-CM

## 2018-05-21 DIAGNOSIS — F17200 Nicotine dependence, unspecified, uncomplicated: Secondary | ICD-10-CM

## 2018-05-21 DIAGNOSIS — F329 Major depressive disorder, single episode, unspecified: Secondary | ICD-10-CM

## 2018-05-21 DIAGNOSIS — M545 Low back pain, unspecified: Secondary | ICD-10-CM

## 2018-05-21 DIAGNOSIS — Z1239 Encounter for other screening for malignant neoplasm of breast: Secondary | ICD-10-CM

## 2018-05-21 DIAGNOSIS — R937 Abnormal findings on diagnostic imaging of other parts of musculoskeletal system: Secondary | ICD-10-CM

## 2018-05-21 DIAGNOSIS — J449 Chronic obstructive pulmonary disease, unspecified: Secondary | ICD-10-CM

## 2018-05-21 DIAGNOSIS — F32A Depression, unspecified: Secondary | ICD-10-CM

## 2018-05-21 DIAGNOSIS — F419 Anxiety disorder, unspecified: Secondary | ICD-10-CM

## 2018-05-21 NOTE — Progress Notes (Signed)
BP 133/74 (BP Location: Right Arm, Patient Position: Sitting, Cuff Size: Large)   Pulse 91   Temp 97.7 F (36.5 C)   Wt 250 lb 8 oz (113.6 kg)   SpO2 98%   BMI 42.66 kg/m    Subjective:    Patient ID: Lori Calderon, female    DOB: 1965-07-27, 53 y.o.   MRN: [id number]  HPI: Lori Calderon is a 53 y.o. female presenting on 05/21/2018 for Hyperlipidemia and COPD   HPI   Pt says dulera helped.  She is still smoking  Pt got xray in december and submitted application [facility]- she says she never heard on it/whether she was approved.  Thus she declined to schedule appointment with orthopedist.   Mammogram ordered at last [facility] but scheduler apparently never was able to get in touch with pt.  Relevant past medical, surgical, family and social history reviewed and updated as indicated. Interim medical history since our last visit reviewed. Allergies and medications reviewed and updated.    Current Outpatient Medications:  .  albuterol (PROVENTIL HFA;VENTOLIN HFA) 108 (90 Base) MCG/ACT inhaler, Inhale 2 puffs into the lungs every 6 (six) hours as needed for wheezing or shortness of breath., Disp: 3 Inhaler, Rfl: 1 .  atorvastatin (LIPITOR) 20 MG tablet, Take 1 tablet (20 mg total) by mouth daily., Disp: 90 tablet, Rfl: 2 .  citalopram (CELEXA) 20 MG tablet, Take 1 tablet (20 mg total) by mouth daily., Disp: 30 tablet, Rfl: 3 .  diclofenac (VOLTAREN) 75 MG EC tablet, Take 1 tablet (75 mg total) by mouth 2 (two) times daily as needed., Disp: 60 tablet, Rfl: 1 .  mometasone-formoterol (DULERA) 100-5 MCG/ACT AERO, Inhale 2 puffs into the lungs 2 (two) times daily., Disp: 3 Inhaler, Rfl: 1   Review of Systems  Constitutional: Positive for fatigue. Negative for appetite change, chills, diaphoresis, fever and unexpected weight change.  HENT: Negative for congestion, dental problem, drooling, ear pain, facial swelling, hearing loss, mouth sores, sneezing, sore throat, trouble  swallowing and voice change.   Eyes: Positive for visual disturbance. Negative for pain, discharge, redness and itching.  Respiratory: Positive for cough and shortness of breath. Negative for choking and wheezing.   Cardiovascular: Negative for chest pain, palpitations and leg swelling.  Gastrointestinal: Negative for abdominal pain, blood in stool, constipation, diarrhea and vomiting.  Endocrine: Positive for cold intolerance, heat intolerance and polydipsia.  Genitourinary: Negative for decreased urine volume, dysuria and hematuria.  Musculoskeletal: Positive for arthralgias, back pain and gait problem.  Skin: Negative for rash.  Allergic/Immunologic: Negative for environmental allergies.  Neurological: Positive for headaches. Negative for seizures, syncope and light-headedness.  Hematological: Negative for adenopathy.  Psychiatric/Behavioral: Positive for agitation. Negative for dysphoric mood and suicidal ideas. The patient is not nervous/anxious.     Per HPI unless specifically indicated above     Objective:    BP 133/74 (BP Location: Right Arm, Patient Position: Sitting, Cuff Size: Large)   Pulse 91   Temp 97.7 F (36.5 C)   Wt 250 lb 8 oz (113.6 kg)   SpO2 98%   BMI 42.66 kg/m   Wt Readings from Last 3 Encounters:  05/21/18 250 lb 8 oz (113.6 kg)  11/21/17 253 lb (114.8 kg)  09/20/17 252 lb (114.3 kg)    Physical Exam Vitals signs reviewed.  Constitutional:      Appearance: She is well-developed.  HENT:     Head: Normocephalic and atraumatic.  Neck:  Musculoskeletal: Neck supple.  Cardiovascular:     Rate and Rhythm: Normal rate and regular rhythm.  Pulmonary:     Effort: Pulmonary effort is normal.     Breath sounds: Normal breath sounds.  Abdominal:     General: Bowel sounds are normal.     Palpations: Abdomen is soft. There is no mass.     Tenderness: There is no abdominal tenderness.  Lymphadenopathy:     Cervical: No cervical adenopathy.  Skin:     General: Skin is warm and dry.  Neurological:     Mental Status: She is alert and oriented to person, place, and time.  Psychiatric:        Behavior: Behavior normal.     Results for orders placed or performed during the hospital encounter of 05/20/18  Lipid panel  Result Value Ref Range   Cholesterol 169 0 - 200 mg/dL   Triglycerides 659 (H) <150 mg/dL   HDL 37 (L) >93 mg/dL   Total CHOL/HDL Ratio 4.6 RATIO   VLDL 31 0 - 40 mg/dL   LDL Cholesterol 570 (H) 0 - 99 mg/dL  Comprehensive metabolic panel  Result Value Ref Range   Sodium 139 135 - 145 mmol/L   Potassium 4.5 3.5 - 5.1 mmol/L   Chloride 104 98 - 111 mmol/L   CO2 26 22 - 32 mmol/L   Glucose, Bld 115 (H) 70 - 99 mg/dL   BUN 14 6 - 20 mg/dL   Creatinine, Ser 1.77 0.44 - 1.00 mg/dL   Calcium 8.9 8.9 - 93.9 mg/dL   Total Protein 7.2 6.5 - 8.1 g/dL   Albumin 3.9 3.5 - 5.0 g/dL   AST 23 15 - 41 U/L   ALT 27 0 - 44 U/L   Alkaline Phosphatase 77 38 - 126 U/L   Total Bilirubin 0.4 0.3 - 1.2 mg/dL   GFR calc non Af Amer >60 >60 mL/min   GFR calc Af Amer >60 >60 mL/min   Anion gap 9 5 - 15      Assessment & Plan:    Encounter Diagnoses  Name Primary?  . Chronic obstructive pulmonary disease, unspecified COPD type (HCC) Yes  . Screening for breast cancer   . Tobacco use disorder   . Hyperlipidemia, unspecified hyperlipidemia type   . Anxiety   . Depression, unspecified depression type   . Morbid obesity (HCC)   . Low back pain without sciatica, unspecified back pain laterality, unspecified chronicity   . Abnormal MRI, lumbar spine     -reviewed labs iwht pt -will re-order Screening mammogram -pt reminded to return iFOBT for colon cancer screening -pt given number to contact financial counselor about [facility] application.  She is to contact our office when she gets approval so her referral can be re-entered -pt to continue current medications.   -counseled smoking cessaiton -pt to follow up 3 months.   RTO sooner prn

## 2018-05-21 NOTE — Patient Instructions (Signed)
FInancial counselor - [phone]   

## 2018-05-27 ENCOUNTER — Ambulatory Visit (HOSPITAL_COMMUNITY): Payer: Self-pay

## 2018-05-27 ENCOUNTER — Encounter (HOSPITAL_COMMUNITY): Payer: Self-pay

## 2018-05-29 ENCOUNTER — Ambulatory Visit (HOSPITAL_COMMUNITY)
Admission: RE | Admit: 2018-05-29 | Discharge: 2018-05-29 | Disposition: A | Discharge status: Home / Self Care | Payer: Self-pay | Source: Ambulatory Visit | Attending: Physician Assistant | Admitting: Physician Assistant

## 2018-05-29 ENCOUNTER — Other Ambulatory Visit: Payer: Self-pay

## 2018-05-29 DIAGNOSIS — Z1239 Encounter for other screening for malignant neoplasm of breast: Secondary | ICD-10-CM | POA: Insufficient documentation

## 2018-07-23 ENCOUNTER — Ambulatory Visit: Payer: Self-pay | Admitting: Physician Assistant

## 2018-07-23 ENCOUNTER — Encounter: Payer: Self-pay | Admitting: Physician Assistant

## 2018-07-23 DIAGNOSIS — R109 Unspecified abdominal pain: Secondary | ICD-10-CM

## 2018-07-23 MED ORDER — OMEPRAZOLE 40 MG PO CPDR: 40.0000 mg | DELAYED_RELEASE_CAPSULE | Freq: Every day | ORAL | 3 refills | Status: DC

## 2018-07-23 NOTE — Progress Notes (Signed)
There were no vitals taken for this visit.   Subjective:    Patient ID: Lori Calderon, female    DOB: [birth year], 53 y.o.   MRN: [id number]  HPI: Lori Calderon is a 53 y.o. female presenting on 07/23/2018 for No chief complaint on file.   HPI   This is a telemedicine appointment through [employer] due tocoronavirus pandemic  I connected with  Lori Calderon on 07/23/18 by a video enabled telemedicine application and verified that I am speaking with the correct person using two identifiers.   I discussed the limitations of evaluation and management by telemedicine. The patient expressed understanding and agreed to proceed.     Pt is still working (at [employer])   Pt complains of Stomach pain x 1 week.  She says it Waxes and wanes but never resolves.  She says Eating makes it worse.  She says it Hurts mostly in the center just above her navel.  Her BMs are normal.  No diarrhea, no constipation or blood.  Stools are not dark.   No emesis.   No fevers that she knows of.  She doesn't check it but she feels hot at times.   She is taking her diclofenac every day.  For over a year.   She hasn't heard anything on her [facility] [employer] [facility] application.  She last called about that a couple weeks ago.   She needs that so she can go to orthopedist for her LBP.    Relevant past medical, surgical, family and social history reviewed and updated as indicated. Interim medical history since our last visit reviewed. Allergies and medications reviewed and updated.    Current Outpatient Medications:  .  albuterol (PROVENTIL HFA;VENTOLIN HFA) 108 (90 Base) MCG/ACT inhaler, Inhale 2 puffs into the lungs every 6 (six) hours as needed for wheezing or shortness of breath., Disp: 3 Inhaler, Rfl: 1 .  atorvastatin (LIPITOR) 20 MG tablet, Take 1 tablet (20 mg total) by mouth daily., Disp: 90 tablet, Rfl: 2 .  citalopram (CELEXA) 20 MG tablet, Take 1 tablet (20 mg total) by mouth daily., Disp: 30 tablet,  Rfl: 3 .  diclofenac (VOLTAREN) 75 MG EC tablet, Take 1 tablet (75 mg total) by mouth 2 (two) times daily as needed., Disp: 60 tablet, Rfl: 1 .  mometasone-formoterol (DULERA) 100-5 MCG/ACT AERO, Inhale 2 puffs into the lungs 2 (two) times daily., Disp: 3 Inhaler, Rfl: 1    Review of Systems  Per HPI unless specifically indicated above     Objective:    There were no vitals taken for this visit.  Wt Readings from Last 3 Encounters:  05/21/18 250 lb 8 oz (113.6 kg)  11/21/17 253 lb (114.8 kg)  09/20/17 252 lb (114.3 kg)    Physical Exam Constitutional:      General: She is not in acute distress.    Appearance: She is obese. She is not ill-appearing.  HENT:     Head: Normocephalic and atraumatic.  Pulmonary:     Effort: Pulmonary effort is normal. No respiratory distress.  Neurological:     Mental Status: She is alert and oriented to person, place, and time.  Psychiatric:        Attention and Perception: Attention normal.        Mood and Affect: Mood normal.        Speech: Speech normal.        Behavior: Behavior normal. Behavior is cooperative.  Thought Content: Thought content normal.           Assessment & Plan:    Encounter Diagnosis  Name Primary?  . Abdominal pain, unspecified abdominal location Yes    -discussed with pt that abdominal pain likely due to diclofenac -she still has iFOBT at home.  She is counseled to Turn in iFOBT tomorrow (or as soon as she has a BM) -Start omeprazole today and stop taking diclofenac.  She is counseled to avoid all other NSAIDS as well.  She can take APAP prn.  Also can use ice and or heat -she is to follow up on 6/10 as scheduled.  She is to contact office sooner for any worsening or new symptoms.  Pt states understanding and is in agreement with plan

## 2018-07-29 ENCOUNTER — Ambulatory Visit: Payer: Self-pay | Admitting: Physician Assistant

## 2018-07-29 ENCOUNTER — Encounter: Payer: Self-pay | Admitting: Physician Assistant

## 2018-07-29 DIAGNOSIS — R109 Unspecified abdominal pain: Secondary | ICD-10-CM

## 2018-07-29 NOTE — Progress Notes (Signed)
cbcd  There were no vitals taken for this visit.   Subjective:    Patient ID: Lori PinesMechelle R Quiroa, female    DOB: [birth year], 53 y.o.   MRN: [id number]  HPI: Lori Calderon is a 53 y.o. female presenting on 07/29/2018 for No chief complaint on file.   HPI    This is a telemedicine appointment through [employer] due to coronavirus pandemic  I connected with  Lori PinesMechelle R Pall on 07/29/18 by a video enabled telemedicine application and verified that I am speaking with the correct person using two identifiers.   I discussed the limitations of evaluation and management by telemedicine. The patient expressed understanding and agreed to proceed.   Pt was seen last week for same abdominal pain and it was felt to be due to nsaid induced gastritis and the pt was to stop her nsaid and start omeprazole.  She has not yet picked up the omeprazole prescribed last week.   She stopped her diclofenac and now occasionally takes APAP if needed.    Pt says she ended up working the whole week last week and she felt fine over the weekend and the pain returned today.    She has had No emesis.  She states at times she has Some nausea a little bit.  This is gone now.  She denies diarrhea and constipation.  She says her stools are not bloody or black.    She is having abdominal pain.  No fever.  (she works at [employer] and has been wearing a mask while there).  She is still able to eat but it does increase her pain.  Her pain is above/superior to her navel.     She did not bring in iFOBT as instructed last week at visit.  She did complete the test but did not bring it in to the office.  So the iFOBT has had stool on it for 7 days.       Relevant past medical, surgical, family and social history reviewed and updated as indicated. Interim medical history since our last visit reviewed. Allergies and medications reviewed and updated.   Current Outpatient Medications:  .  albuterol (PROVENTIL HFA;VENTOLIN HFA) 108 (90  Base) MCG/ACT inhaler, Inhale 2 puffs into the lungs every 6 (six) hours as needed for wheezing or shortness of breath., Disp: 3 Inhaler, Rfl: 1 .  atorvastatin (LIPITOR) 20 MG tablet, Take 1 tablet (20 mg total) by mouth daily., Disp: 90 tablet, Rfl: 2 .  citalopram (CELEXA) 20 MG tablet, Take 1 tablet (20 mg total) by mouth daily., Disp: 30 tablet, Rfl: 3 .  mometasone-formoterol (DULERA) 100-5 MCG/ACT AERO, Inhale 2 puffs into the lungs 2 (two) times daily., Disp: 3 Inhaler, Rfl: 1 .  diclofenac (VOLTAREN) 75 MG EC tablet, Take 1 tablet (75 mg total) by mouth 2 (two) times daily as needed. (Patient not taking: Reported on 07/29/2018), Disp: 60 tablet, Rfl: 1 .  omeprazole (PRILOSEC) 40 MG capsule, Take 1 capsule (40 mg total) by mouth daily. (Patient not taking: Reported on 07/29/2018), Disp: 30 capsule, Rfl: 3     Review of Systems  Per HPI unless specifically indicated above     Objective:    There were no vitals taken for this visit.  Wt Readings from Last 3 Encounters:  05/21/18 250 lb 8 oz (113.6 kg)  11/21/17 253 lb (114.8 kg)  09/20/17 252 lb (114.3 kg)    Physical Exam Constitutional:      General: She is  not in acute distress.    Appearance: She is obese. She is not ill-appearing.  HENT:     Head: Normocephalic and atraumatic.  Pulmonary:     Effort: Pulmonary effort is normal. No respiratory distress.  Neurological:     Mental Status: She is alert and oriented to person, place, and time.  Psychiatric:        Attention and Perception: Attention normal.        Mood and Affect: Mood normal.        Speech: Speech normal.        Behavior: Behavior is cooperative.         Assessment & Plan:    Encounter Diagnosis  Name Primary?  . Abdominal pain, unspecified abdominal location Yes    -epigastric abdominal pain likely due to nsaid induced gastritis -Discussed with pt the importance of her taking the omeprazole and that she needs to start it today.   -She needs  to continue to avoid nsaids -Will check cbc today.  Since iFOBT no longer reliable, will have pt take stool sample to lab to be checked for occult blood.   -Pt is to notify office for any new symptoms or changes.  She is counseled to go to ER if she starts vomiting blood.   -Note to be out of work until next monday -pt has routine appointment on June 10

## 2018-07-30 ENCOUNTER — Other Ambulatory Visit (HOSPITAL_COMMUNITY)
Admission: RE | Admit: 2018-07-30 | Discharge: 2018-07-30 | Disposition: A | Discharge status: Home / Self Care | Payer: Self-pay | Source: Ambulatory Visit | Attending: Physician Assistant | Admitting: Physician Assistant

## 2018-07-30 DIAGNOSIS — R109 Unspecified abdominal pain: Secondary | ICD-10-CM | POA: Insufficient documentation

## 2018-07-30 LAB — LIPASE, BLOOD: Lipase: 28 U/L (ref 11–51)

## 2018-07-30 LAB — COMPREHENSIVE METABOLIC PANEL
ALT: 22 U/L (ref 0–44)
AST: 19 U/L (ref 15–41)
Albumin: 4 g/dL (ref 3.5–5.0)
Alkaline Phosphatase: 75 U/L (ref 38–126)
Anion gap: 7 (ref 5–15)
BUN: 18 mg/dL (ref 6–20)
CO2: 27 mmol/L (ref 22–32)
Calcium: 9 mg/dL (ref 8.9–10.3)
Chloride: 105 mmol/L (ref 98–111)
Creatinine, Ser: 0.86 mg/dL (ref 0.44–1.00)
GFR calc Af Amer: >60 mL/min (ref >60)
GFR calc non Af Amer: >60 mL/min (ref >60)
Glucose, Bld: 103 mg/dL — ABNORMAL HIGH (ref 70–99)
Potassium: 4.5 mmol/L (ref 3.5–5.1)
Sodium: 139 mmol/L (ref 135–145)
Total Bilirubin: 0.5 mg/dL (ref 0.3–1.2)
Total Protein: 7.4 g/dL (ref 6.5–8.1)

## 2018-07-30 LAB — CBC
HCT: 46.8 % — ABNORMAL HIGH (ref 36.0–46.0)
Hemoglobin: 15 g/dL (ref 12.0–15.0)
MCH: 30.4 pg (ref 26.0–34.0)
MCHC: 32.1 g/dL (ref 30.0–36.0)
MCV: 94.7 fL (ref 80.0–100.0)
Platelets: 282 10*3/uL (ref 150–400)
RBC: 4.94 MIL/uL (ref 3.87–5.11)
RDW: 13.2 % (ref 11.5–15.5)
WBC: 5.2 10*3/uL (ref 4.0–10.5)
nRBC: 0 % (ref 0.0–0.2)

## 2018-07-30 LAB — OCCULT BLOOD X 1 CARD TO LAB, STOOL: Fecal Occult Bld: NEGATIVE

## 2018-07-30 LAB — AMYLASE: Amylase: 34 U/L (ref 28–100)

## 2018-08-21 ENCOUNTER — Encounter: Payer: Self-pay | Admitting: Physician Assistant

## 2018-08-21 ENCOUNTER — Ambulatory Visit: Payer: Self-pay | Admitting: Physician Assistant

## 2018-08-21 DIAGNOSIS — F419 Anxiety disorder, unspecified: Secondary | ICD-10-CM

## 2018-08-21 DIAGNOSIS — F329 Major depressive disorder, single episode, unspecified: Secondary | ICD-10-CM

## 2018-08-21 DIAGNOSIS — E785 Hyperlipidemia, unspecified: Secondary | ICD-10-CM

## 2018-08-21 DIAGNOSIS — F32A Depression, unspecified: Secondary | ICD-10-CM

## 2018-08-21 DIAGNOSIS — F17200 Nicotine dependence, unspecified, uncomplicated: Secondary | ICD-10-CM

## 2018-08-21 DIAGNOSIS — J449 Chronic obstructive pulmonary disease, unspecified: Secondary | ICD-10-CM

## 2018-08-21 DIAGNOSIS — R109 Unspecified abdominal pain: Secondary | ICD-10-CM

## 2018-08-21 NOTE — Progress Notes (Signed)
There were no vitals taken for this visit.   Subjective:    Patient ID: Lori Calderon, female    DOB: [birth year], 53 y.o.   MRN: [id number]  HPI: Lori Calderon is a 53 y.o. female presenting on 08/21/2018 for No chief complaint on file.   HPI  This is a telemedicine appointment through [employer] due to coronavirus pandemic  I connected with  Lori Calderon on 08/21/18 by a video enabled telemedicine application and verified that I am speaking with the correct person using two identifiers.   I discussed the limitations of evaluation and management by telemedicine. The patient expressed understanding and agreed to proceed.  Pt is at home.  Provider is at office/clinic  She is taking the omeprazole.  Her stomach symptoms have improved  Her breathing is okay.  She is still smoking  She is still working.  She wears a mask there.  It is hot.   She says her Mood is improved  Now that she is taking her medication regularly.    Relevant past medical, surgical, family and social history reviewed and updated as indicated. Interim medical history since our last visit reviewed. Allergies and medications reviewed and updated.   Current Outpatient Medications:  .  albuterol (PROVENTIL HFA;VENTOLIN HFA) 108 (90 Base) MCG/ACT inhaler, Inhale 2 puffs into the lungs every 6 (six) hours as needed for wheezing or shortness of breath., Disp: 3 Inhaler, Rfl: 1 .  atorvastatin (LIPITOR) 20 MG tablet, Take 1 tablet (20 mg total) by mouth daily., Disp: 90 tablet, Rfl: 2 .  citalopram (CELEXA) 20 MG tablet, Take 1 tablet (20 mg total) by mouth daily., Disp: 30 tablet, Rfl: 3 .  mometasone-formoterol (DULERA) 100-5 MCG/ACT AERO, Inhale 2 puffs into the lungs 2 (two) times daily., Disp: 3 Inhaler, Rfl: 1 .  omeprazole (PRILOSEC) 40 MG capsule, Take 1 capsule (40 mg total) by mouth daily., Disp: 30 capsule, Rfl: 3   Review of Systems  Per HPI unless specifically indicated above     Objective:    There were no vitals taken for this visit.  Wt Readings from Last 3 Encounters:  05/21/18 250 lb 8 oz (113.6 kg)  11/21/17 253 lb (114.8 kg)  09/20/17 252 lb (114.3 kg)    Physical Exam Constitutional:      General: She is not in acute distress.    Appearance: Normal appearance. She is obese. She is not ill-appearing.  HENT:     Head: Normocephalic and atraumatic.  Pulmonary:     Effort: Pulmonary effort is normal. No respiratory distress.  Neurological:     Mental Status: She is alert and oriented to person, place, and time.  Psychiatric:        Attention and Perception: Attention normal.        Mood and Affect: Mood normal. Affect is flat.        Speech: Speech normal.        Behavior: Behavior is cooperative.     Comments: Pt is at her baseline mood         Assessment & Plan:    Encounter Diagnoses  Name Primary?  . Chronic obstructive pulmonary disease, unspecified COPD type (HCC) Yes  . Abdominal pain, unspecified abdominal location   . Hyperlipidemia, unspecified hyperlipidemia type   . Anxiety   . Depression, unspecified depression type   . Tobacco use disorder      -pt to continue current medications  -pt to continue omeprazole for  likely nsaid induced gastritis  -encouraged smoking cessation  -Pt encouraged to wear mask when in public per recommendations of CDC to reduce risk of getting covid-19 virus  -pt to follow up in 3 months.  She is to contact office sooner prn

## 2018-10-23 ENCOUNTER — Telehealth: Payer: Self-pay | Admitting: Student

## 2018-10-23 NOTE — Telephone Encounter (Signed)
Message ----- From: Soyla Dryer, PA-C Sent: 10/23/2018  10:48 AM EDT To: Maddalena Linarez De-Los Frederico Hamman, LPN  For [employer], her eye is likely viral issue.   She can use artificial tears, clean the lid gently with wash cloth, apply warm or cool compresses.  If she is still having issue, she should call first thing Monday for telemedicine appt.  LPN called and notified pt of above. Pt verbalized understanding.

## 2018-10-23 NOTE — Telephone Encounter (Signed)
Pt called c/o L eye being puffy, swollen, red uncomfortable, having yellowish discharge, crust on eye after waking up in the morning and after taking a nap and itching. Sx began yesterday 10-22-18. Pt states having no congestion, a little runny nose yesterday, but none today, and an ear ache earlier this week on Sunday or Monday but it has been resolved. Pt states R eye is fine.  LPN explained to patient that PA Is out of the office for the week, but will send a staff message notifying her of pt's sx. LPN explained to patient that LPN will call her back after getting response from PA. Pt verbalized understanding.  If rx to be sent pt prefers for it to be sent to [facility] in [place].

## 2018-11-19 ENCOUNTER — Ambulatory Visit: Payer: Self-pay | Admitting: Physician Assistant

## 2018-11-25 ENCOUNTER — Ambulatory Visit: Payer: Self-pay | Admitting: Physician Assistant

## 2018-11-29 ENCOUNTER — Other Ambulatory Visit (HOSPITAL_COMMUNITY)
Admission: RE | Admit: 2018-11-29 | Discharge: 2018-11-29 | Disposition: A | Discharge status: Home / Self Care | Payer: Self-pay | Source: Ambulatory Visit | Attending: Physician Assistant | Admitting: Physician Assistant

## 2018-11-29 ENCOUNTER — Other Ambulatory Visit: Payer: Self-pay

## 2018-11-29 DIAGNOSIS — E785 Hyperlipidemia, unspecified: Secondary | ICD-10-CM | POA: Insufficient documentation

## 2018-11-29 LAB — LIPID PANEL
Cholesterol: 194 mg/dL (ref 0–200)
HDL: 32 mg/dL — ABNORMAL LOW (ref >40)
LDL Cholesterol: 138 mg/dL — ABNORMAL HIGH (ref 0–99)
Total CHOL/HDL Ratio: 6.1 RATIO
Triglycerides: 121 mg/dL (ref <150)
VLDL: 24 mg/dL (ref 0–40)

## 2018-11-29 LAB — COMPREHENSIVE METABOLIC PANEL
ALT: 25 U/L (ref 0–44)
AST: 23 U/L (ref 15–41)
Albumin: 3.9 g/dL (ref 3.5–5.0)
Alkaline Phosphatase: 77 U/L (ref 38–126)
Anion gap: 9 (ref 5–15)
BUN: 15 mg/dL (ref 6–20)
CO2: 25 mmol/L (ref 22–32)
Calcium: 8.7 mg/dL — ABNORMAL LOW (ref 8.9–10.3)
Chloride: 105 mmol/L (ref 98–111)
Creatinine, Ser: 0.85 mg/dL (ref 0.44–1.00)
GFR calc Af Amer: >60 mL/min (ref >60)
GFR calc non Af Amer: >60 mL/min (ref >60)
Glucose, Bld: 104 mg/dL — ABNORMAL HIGH (ref 70–99)
Potassium: 4.3 mmol/L (ref 3.5–5.1)
Sodium: 139 mmol/L (ref 135–145)
Total Bilirubin: 0.5 mg/dL (ref 0.3–1.2)
Total Protein: 7 g/dL (ref 6.5–8.1)

## 2018-12-02 ENCOUNTER — Ambulatory Visit: Payer: Self-pay | Admitting: Physician Assistant

## 2018-12-02 ENCOUNTER — Encounter: Payer: Self-pay | Admitting: Physician Assistant

## 2018-12-02 DIAGNOSIS — F32A Depression, unspecified: Secondary | ICD-10-CM

## 2018-12-02 DIAGNOSIS — E669 Obesity, unspecified: Secondary | ICD-10-CM

## 2018-12-02 DIAGNOSIS — J449 Chronic obstructive pulmonary disease, unspecified: Secondary | ICD-10-CM

## 2018-12-02 DIAGNOSIS — F17200 Nicotine dependence, unspecified, uncomplicated: Secondary | ICD-10-CM

## 2018-12-02 DIAGNOSIS — F329 Major depressive disorder, single episode, unspecified: Secondary | ICD-10-CM

## 2018-12-02 DIAGNOSIS — F419 Anxiety disorder, unspecified: Secondary | ICD-10-CM

## 2018-12-02 DIAGNOSIS — E785 Hyperlipidemia, unspecified: Secondary | ICD-10-CM

## 2018-12-02 NOTE — Progress Notes (Signed)
There were no vitals taken for this visit.   Subjective:    Patient ID: Lori PinesMechelle R Przybysz, female    DOB: 02/10/1966, 53 y.o.   MRN: [id number]  HPI: Lori Calderon is a 53 y.o. female presenting on 12/02/2018 for COPD and Hyperlipidemia   HPI  This is a telemedicine appointment through [employer] due to coronavirus pandemic  I connected with  Lori PinesMechelle R Deuser on 12/02/18 by a video enabled telemedicine application and verified that I am speaking with the correct person using two identifiers.   I discussed the limitations of evaluation and management by telemedicine. The patient expressed understanding and agreed to proceed.  Pt is in her car (taking a break while at work).  Provider is at office.     Pt relates Some depression.  She says she has increased stress- work and home situation.  She has termites.  Also some Relationship issues.  .she denies SI, HI.   She says the depression is likely why she is out of some of her meds.    Pt says that physically she is doing okay.    Relevant past medical, surgical, family and social history reviewed and updated as indicated. Interim medical history since our last visit reviewed. Allergies and medications reviewed and updated.    Current Outpatient Medications:  .  albuterol (PROVENTIL HFA;VENTOLIN HFA) 108 (90 Base) MCG/ACT inhaler, Inhale 2 puffs into the lungs every 6 (six) hours as needed for wheezing or shortness of breath., Disp: 3 Inhaler, Rfl: 1 .  citalopram (CELEXA) 20 MG tablet, Take 1 tablet (20 mg total) by mouth daily., Disp: 30 tablet, Rfl: 3 .  mometasone-formoterol (DULERA) 100-5 MCG/ACT AERO, Inhale 2 puffs into the lungs 2 (two) times daily., Disp: 3 Inhaler, Rfl: 1 .  atorvastatin (LIPITOR) 20 MG tablet, Take 1 tablet (20 mg total) by mouth daily. (Patient not taking: Reported on 12/02/2018), Disp: 90 tablet, Rfl: 2 .  omeprazole (PRILOSEC) 40 MG capsule, Take 1 capsule (40 mg total) by mouth daily. (Patient not  taking: Reported on 12/02/2018), Disp: 30 capsule, Rfl: 3       Review of Systems  Per HPI unless specifically indicated above     Objective:    There were no vitals taken for this visit.  Wt Readings from Last 3 Encounters:  05/21/18 250 lb 8 oz (113.6 kg)  11/21/17 253 lb (114.8 kg)  09/20/17 252 lb (114.3 kg)    Physical Exam Constitutional:      General: She is not in acute distress.    Appearance: Normal appearance. She is obese. She is not ill-appearing.  HENT:     Head: Normocephalic and atraumatic.  Pulmonary:     Effort: Pulmonary effort is normal. No respiratory distress.  Neurological:     Mental Status: She is alert and oriented to person, place, and time.  Psychiatric:        Attention and Perception: Attention normal.        Speech: Speech normal.        Behavior: Behavior is cooperative.     Results for orders placed or performed during the hospital encounter of 11/29/18  Lipid panel  Result Value Ref Range   Cholesterol 194 0 - 200 mg/dL   Triglycerides 295121 <284<150 mg/dL   HDL 32 (L) >13>40 mg/dL   Total CHOL/HDL Ratio 6.1 RATIO   VLDL 24 0 - 40 mg/dL   LDL Cholesterol 244138 (H) 0 - 99 mg/dL  Comprehensive metabolic  panel  Result Value Ref Range   Sodium 139 135 - 145 mmol/L   Potassium 4.3 3.5 - 5.1 mmol/L   Chloride 105 98 - 111 mmol/L   CO2 25 22 - 32 mmol/L   Glucose, Bld 104 (H) 70 - 99 mg/dL   BUN 15 6 - 20 mg/dL   Creatinine, Ser 0.85 0.44 - 1.00 mg/dL   Calcium 8.7 (L) 8.9 - 10.3 mg/dL   Total Protein 7.0 6.5 - 8.1 g/dL   Albumin 3.9 3.5 - 5.0 g/dL   AST 23 15 - 41 U/L   ALT 25 0 - 44 U/L   Alkaline Phosphatase 77 38 - 126 U/L   Total Bilirubin 0.5 0.3 - 1.2 mg/dL   GFR calc non Af Amer >60 >60 mL/min   GFR calc Af Amer >60 >60 mL/min   Anion gap 9 5 - 15      Assessment & Plan:   Encounter Diagnoses  Name Primary?  . Hyperlipidemia, unspecified hyperlipidemia type Yes  . Chronic obstructive pulmonary disease, unspecified COPD  type ([place])   . Depression, unspecified depression type   . Anxiety   . Tobacco use disorder   . Obesity, unspecified classification, unspecified obesity type, unspecified whether serious comorbidity present       -reviewed labs with pt  -pt encouraged to contact [facility] for counseling -pt to get back on lipitor.  No changes to medications today -pt to follow up 1 month with telemedicine visit to check on mood.  She is to contact office sooner prn

## 2018-12-05 ENCOUNTER — Telehealth: Payer: Self-pay | Admitting: Student

## 2018-12-05 ENCOUNTER — Other Ambulatory Visit: Payer: Self-pay | Admitting: Physician Assistant

## 2018-12-05 MED ORDER — OLOPATADINE HCL 0.2 % OP SOLN: 1.0000 [drp] | Freq: Every day | OPHTHALMIC | 1 refills | Status: DC

## 2018-12-05 NOTE — Telephone Encounter (Addendum)
Pt called c/o eyes burning, stinging, being red, and watering up. Pt states this has been going on intermittently for a year lasting about an hour. Pt states it occurs to bilateral eyes. Pt denies itching, head congestion, and ear ache. Pt states she hasn't had HA with her sx in the past but is experiencing HA today. Pt states she has been using artificial tears and zaditor as needed.   PA rx pataday to [facility] in [place] and pt is to mention this to PA at f/u appt to discuss further.  LPN notified pt and pt verbalized understanding.

## 2018-12-16 ENCOUNTER — Encounter: Payer: Self-pay | Admitting: Physician Assistant

## 2018-12-16 ENCOUNTER — Other Ambulatory Visit: Payer: Self-pay

## 2018-12-16 ENCOUNTER — Ambulatory Visit: Payer: Self-pay | Admitting: Physician Assistant

## 2018-12-16 DIAGNOSIS — Z20822 Contact with and (suspected) exposure to covid-19: Secondary | ICD-10-CM

## 2018-12-16 DIAGNOSIS — R05 Cough: Secondary | ICD-10-CM

## 2018-12-16 DIAGNOSIS — F17200 Nicotine dependence, unspecified, uncomplicated: Secondary | ICD-10-CM

## 2018-12-16 DIAGNOSIS — J449 Chronic obstructive pulmonary disease, unspecified: Secondary | ICD-10-CM

## 2018-12-16 DIAGNOSIS — R059 Cough, unspecified: Secondary | ICD-10-CM

## 2018-12-16 DIAGNOSIS — R0981 Nasal congestion: Secondary | ICD-10-CM

## 2018-12-16 NOTE — Progress Notes (Signed)
There were no vitals taken for this visit.   Subjective:    Patient ID: Lori Calderon, female    DOB: [birth year], 53 y.o.   MRN: [id number]  HPI: Lori Calderon is a 53 y.o. female presenting on 12/16/2018 for No chief complaint on file.   HPI    This is a telemedicine appointment through [employer] due to coronavirus pandemic.  I connected with  Lori Calderon on 12/16/18 by a video enabled telemedicine application and verified that I am speaking with the correct person using two identifiers.   I discussed the limitations of evaluation and management by telemedicine. The patient expressed understanding and agreed to proceed.  Pt is at home.  Provider is at office.   Pt states "I'm so sick".  Also "I feel horrible".  She says she Started feeling badly on yesterday.  She complains of nasal congestion, feels very stuffy,  some body aches, cough which is productive, some sob.  No fever.    She says the coughing is the worst thing.    Pt works at [employer].  The Last day of work was Thursday.    Over the weekend, she Went to [place] and went to the flea market in [place].     Relevant past medical, surgical, family and social history reviewed and updated as indicated. Interim medical history since our last visit reviewed. Allergies and medications reviewed and updated.   Current Outpatient Medications:  .  albuterol (PROVENTIL HFA;VENTOLIN HFA) 108 (90 Base) MCG/ACT inhaler, Inhale 2 puffs into the lungs every 6 (six) hours as needed for wheezing or shortness of breath., Disp: 3 Inhaler, Rfl: 1 .  atorvastatin (LIPITOR) 20 MG tablet, Take 1 tablet (20 mg total) by mouth daily., Disp: 90 tablet, Rfl: 2 .  citalopram (CELEXA) 20 MG tablet, Take 1 tablet (20 mg total) by mouth daily., Disp: 30 tablet, Rfl: 3 .  mometasone-formoterol (DULERA) 100-5 MCG/ACT AERO, Inhale 2 puffs into the lungs 2 (two) times daily., Disp: 3 Inhaler, Rfl: 1 .  Olopatadine HCl 0.2 % SOLN, Apply 1 drop to eye  daily., Disp: 2.5 mL, Rfl: 1 .  omeprazole (PRILOSEC) 40 MG capsule, Take 1 capsule (40 mg total) by mouth daily. (Patient not taking: Reported on 12/02/2018), Disp: 30 capsule, Rfl: 3    Review of Systems  Per HPI unless specifically indicated above     Objective:    There were no vitals taken for this visit.  Wt Readings from Last 3 Encounters:  05/21/18 250 lb 8 oz (113.6 kg)  11/21/17 253 lb (114.8 kg)  09/20/17 252 lb (114.3 kg)    Physical Exam Constitutional:      General: She is not in acute distress.    Appearance: She is ill-appearing. She is not toxic-appearing.  HENT:     Head: Normocephalic and atraumatic.     Nose: Congestion and rhinorrhea present.  Eyes:     Comments: Puffy around the eyes which are watery  Pulmonary:     Effort: No respiratory distress.  Neurological:     Mental Status: She is alert and oriented to person, place, and time.  Psychiatric:        Attention and Perception: Attention normal.        Speech: Speech normal.        Behavior: Behavior is cooperative.         Assessment & Plan:    Encounter Diagnoses  Name Primary?  . Suspected COVID-19 virus  infection Yes  . Cough   . Nasal congestion   . Tobacco use disorder   . Chronic obstructive pulmonary disease, unspecified COPD type ([place])     -Pt to get covid19 test.  She can get tested tomorrow in [place] or if she has someone who can safely drive her to [facility], she can get tested today. -pt counseled on self-isolating -we will fax out of work note for her (she needs to get a number) -she has inhaler to use as needed -encouraged pt to avoid smoking -pt counseled to go to ER for SOB.  She can call office if new symptoms or other problems.

## 2018-12-18 ENCOUNTER — Encounter: Payer: Self-pay | Admitting: Physician Assistant

## 2018-12-18 ENCOUNTER — Ambulatory Visit: Payer: Self-pay | Admitting: Physician Assistant

## 2018-12-18 DIAGNOSIS — J449 Chronic obstructive pulmonary disease, unspecified: Secondary | ICD-10-CM

## 2018-12-18 DIAGNOSIS — R0981 Nasal congestion: Secondary | ICD-10-CM

## 2018-12-18 DIAGNOSIS — R05 Cough: Secondary | ICD-10-CM

## 2018-12-18 DIAGNOSIS — R059 Cough, unspecified: Secondary | ICD-10-CM

## 2018-12-18 DIAGNOSIS — F17200 Nicotine dependence, unspecified, uncomplicated: Secondary | ICD-10-CM

## 2018-12-18 DIAGNOSIS — J209 Acute bronchitis, unspecified: Secondary | ICD-10-CM

## 2018-12-18 LAB — NOVEL CORONAVIRUS, NAA: SARS-CoV-2, NAA: NOT DETECTED

## 2018-12-18 MED ORDER — AZITHROMYCIN 250 MG PO TABS: ORAL_TABLET | ORAL | 0 refills | Status: DC

## 2018-12-18 MED ORDER — PREDNISONE 20 MG PO TABS: 20.0000 mg | ORAL_TABLET | Freq: Two times a day (BID) | ORAL | 0 refills | Status: DC

## 2018-12-18 NOTE — Progress Notes (Signed)
There were no vitals taken for this visit.   Subjective:    Patient ID: Lori Calderon, female    DOB: 08-Jun-1965, 53 y.o.   MRN: [id number]  HPI: Lori Calderon is a 53 y.o. female presenting on 12/18/2018 for No chief complaint on file.   HPI  This is a telemedicine appointment through [facility] due to coronavirus pandemic.    I connected with  Lori Calderon on 12/18/18 by a video enabled telemedicine application and verified that I am speaking with the correct person using two identifiers.   I discussed the limitations of evaluation and management by telemedicine. The patient expressed understanding and agreed to proceed.  Pt is at home.  Provider is at office.  Pt seen Monday and sent for covid testing.  She is Still sick.  Fever yesterday 100.3.  Some sob.  + coughing.  She is using her inhaler bid.  She says she has cut back smoking.     Relevant past medical, surgical, family and social history reviewed and updated as indicated. Interim medical history since our last visit reviewed. Allergies and medications reviewed and updated.   Current Outpatient Medications:  .  albuterol (PROVENTIL HFA;VENTOLIN HFA) 108 (90 Base) MCG/ACT inhaler, Inhale 2 puffs into the lungs every 6 (six) hours as needed for wheezing or shortness of breath., Disp: 3 Inhaler, Rfl: 1 .  atorvastatin (LIPITOR) 20 MG tablet, Take 1 tablet (20 mg total) by mouth daily., Disp: 90 tablet, Rfl: 2 .  citalopram (CELEXA) 20 MG tablet, Take 1 tablet (20 mg total) by mouth daily., Disp: 30 tablet, Rfl: 3 .  mometasone-formoterol (DULERA) 100-5 MCG/ACT AERO, Inhale 2 puffs into the lungs 2 (two) times daily., Disp: 3 Inhaler, Rfl: 1 .  Olopatadine HCl 0.2 % SOLN, Apply 1 drop to eye daily., Disp: 2.5 mL, Rfl: 1 .  omeprazole (PRILOSEC) 40 MG capsule, Take 1 capsule (40 mg total) by mouth daily., Disp: 30 capsule, Rfl: 3     Review of Systems  Per HPI unless specifically indicated above     Objective:     There were no vitals taken for this visit.  Wt Readings from Last 3 Encounters:  05/21/18 250 lb 8 oz (113.6 kg)  11/21/17 253 lb (114.8 kg)  09/20/17 252 lb (114.3 kg)    Physical Exam Constitutional:      General: She is not in acute distress.    Appearance: She is ill-appearing. She is not toxic-appearing.     Comments: Pt looks like she feels really bad.  She is not toxic.    HENT:     Head: Normocephalic and atraumatic.     Nose: Congestion present.  Pulmonary:     Effort: No tachypnea, bradypnea or respiratory distress.  Neurological:     Mental Status: She is alert and oriented to person, place, and time.  Psychiatric:        Attention and Perception: Attention normal.        Speech: Speech normal.        Behavior: Behavior is cooperative.     Results for orders placed or performed in visit on 12/16/18  Novel Coronavirus, NAA ([facility])   Specimen: Nasopharyngeal(NP) swabs in vial transport medium   NASOPHARYNGE  TESTING  Result Value Ref Range   SARS-CoV-2, NAA Not Detected Not Detected      Assessment & Plan:    Encounter Diagnoses  Name Primary?  . Acute bronchitis, unspecified organism Yes  .  Cough   . Nasal congestion   . Tobacco use disorder   . Chronic obstructive pulmonary disease, unspecified COPD type ([place])      -Rx zpack and prednisone -Urged pt to avoid smoking -pt to Use inhaler as needed -reviewed results covid test.  Discussed with pt that she still needs to avoid being around others because she is sick.   -Will give RTW note for 12/25/18.   Her daughter will pick up the note.  She is to contact office if she is still sick and unable to RTW on 10/14 -pt has appointment for influenza immunization on 12/23/18.  She is told to not come if she is still sick -pt to contact office if worsens or persists.  She is told to go to ER for problems breathing or if needed

## 2018-12-30 ENCOUNTER — Encounter: Payer: Self-pay | Admitting: Physician Assistant

## 2018-12-30 ENCOUNTER — Ambulatory Visit: Payer: Self-pay | Admitting: Physician Assistant

## 2018-12-30 DIAGNOSIS — F32A Depression, unspecified: Secondary | ICD-10-CM

## 2018-12-30 DIAGNOSIS — F419 Anxiety disorder, unspecified: Secondary | ICD-10-CM

## 2018-12-30 DIAGNOSIS — E785 Hyperlipidemia, unspecified: Secondary | ICD-10-CM

## 2018-12-30 DIAGNOSIS — F329 Major depressive disorder, single episode, unspecified: Secondary | ICD-10-CM

## 2018-12-30 DIAGNOSIS — J449 Chronic obstructive pulmonary disease, unspecified: Secondary | ICD-10-CM

## 2018-12-30 DIAGNOSIS — F17200 Nicotine dependence, unspecified, uncomplicated: Secondary | ICD-10-CM

## 2018-12-30 MED ORDER — ATORVASTATIN CALCIUM 20 MG PO TABS: 20.0000 mg | ORAL_TABLET | Freq: Every day | ORAL | 2 refills | Status: DC

## 2018-12-30 MED ORDER — ALBUTEROL SULFATE HFA 108 (90 BASE) MCG/ACT IN AERS: 2.0000 | INHALATION_SPRAY | Freq: Four times a day (QID) | RESPIRATORY_TRACT | 1 refills | Status: AC | PRN

## 2018-12-30 MED ORDER — DULERA 100-5 MCG/ACT IN AERO: 2.0000 | INHALATION_SPRAY | Freq: Two times a day (BID) | RESPIRATORY_TRACT | 1 refills | Status: DC

## 2018-12-30 MED ORDER — OMEPRAZOLE 40 MG PO CPDR: 40.0000 mg | DELAYED_RELEASE_CAPSULE | Freq: Every day | ORAL | 1 refills | Status: DC

## 2018-12-30 NOTE — Progress Notes (Signed)
There were no vitals taken for this visit.   Subjective:    Patient ID: Lori Calderon, female    DOB: January 09, 1966, 53 y.o.   MRN: [id number]  HPI: Lori Calderon is a 53 y.o. female presenting on 12/30/2018 for No chief complaint on file.   HPI    This is a telemedicine appointment through [facility] due to coronavirus pandemic.  I connected with  Lori Calderon on 12/30/18 by a video enabled telemedicine application and verified that I am speaking with the correct person using two identifiers.   I discussed the limitations of evaluation and management by telemedicine. The patient expressed understanding and agreed to proceed.  Pt is at her work (sitting outside in her car).  Provider is at office   She is improved from her recent illness but says she is still very tired  She is out of her dulera.  She is still smoking.    She is not having any more sob and denies fever.    She says her mood is good.  She is mostly just tired.     Relevant past medical, surgical, family and social history reviewed and updated as indicated. Interim medical history since our last visit reviewed. Allergies and medications reviewed and updated.   Current Outpatient Medications:  .  albuterol (PROVENTIL HFA;VENTOLIN HFA) 108 (90 Base) MCG/ACT inhaler, Inhale 2 puffs into the lungs every 6 (six) hours as needed for wheezing or shortness of breath., Disp: 3 Inhaler, Rfl: 1 .  citalopram (CELEXA) 20 MG tablet, Take 1 tablet (20 mg total) by mouth daily., Disp: 30 tablet, Rfl: 3 .  Olopatadine HCl 0.2 % SOLN, Apply 1 drop to eye daily., Disp: 2.5 mL, Rfl: 1 .  atorvastatin (LIPITOR) 20 MG tablet, Take 1 tablet (20 mg total) by mouth daily. (Patient not taking: Reported on 12/30/2018), Disp: 90 tablet, Rfl: 2 .  mometasone-formoterol (DULERA) 100-5 MCG/ACT AERO, Inhale 2 puffs into the lungs 2 (two) times daily. (Patient not taking: Reported on 12/30/2018), Disp: 3 Inhaler, Rfl: 1 .  omeprazole  (PRILOSEC) 40 MG capsule, Take 1 capsule (40 mg total) by mouth daily. (Patient not taking: Reported on 12/30/2018), Disp: 30 capsule, Rfl: 3    Review of Systems  Per HPI unless specifically indicated above     Objective:    There were no vitals taken for this visit.  Wt Readings from Last 3 Encounters:  05/21/18 250 lb 8 oz (113.6 kg)  11/21/17 253 lb (114.8 kg)  09/20/17 252 lb (114.3 kg)    Physical Exam Constitutional:      General: She is not in acute distress.    Comments: Pt looks tired.   HENT:     Head: Normocephalic and atraumatic.  Pulmonary:     Effort: No respiratory distress.  Neurological:     Mental Status: She is alert and oriented to person, place, and time.  Psychiatric:        Attention and Perception: Attention normal.        Speech: Speech normal.        Behavior: Behavior is cooperative.     Results for orders placed or performed in visit on 12/16/18  Novel Coronavirus, NAA ([facility])   Specimen: Nasopharyngeal(NP) swabs in vial transport medium   NASOPHARYNGE  TESTING  Result Value Ref Range   SARS-CoV-2, NAA Not Detected Not Detected      Assessment & Plan:    Encounter Diagnoses  Name Primary?  Marland Kitchen  Depression, unspecified depression type Yes  . Anxiety   . Tobacco use disorder   . Hyperlipidemia, unspecified hyperlipidemia type   . Chronic obstructive pulmonary disease, unspecified COPD type (HCC)       -She has appt for influenza immunization -she is counseled to Get back on her meds -encouraged her to Avoid smoking -she is to Follow up 3 months.  She is to Contact office sooner prn worsening or new symptoms

## 2019-01-07 ENCOUNTER — Other Ambulatory Visit: Payer: Self-pay | Admitting: Physician Assistant

## 2019-01-07 DIAGNOSIS — R350 Frequency of micturition: Secondary | ICD-10-CM

## 2019-01-08 ENCOUNTER — Ambulatory Visit: Payer: Self-pay | Admitting: Physician Assistant

## 2019-01-08 ENCOUNTER — Encounter: Payer: Self-pay | Admitting: Physician Assistant

## 2019-01-08 ENCOUNTER — Other Ambulatory Visit (HOSPITAL_COMMUNITY)
Admission: RE | Admit: 2019-01-08 | Discharge: 2019-01-08 | Disposition: A | Discharge status: Home / Self Care | Payer: Self-pay | Source: Ambulatory Visit | Attending: Physician Assistant | Admitting: Physician Assistant

## 2019-01-08 DIAGNOSIS — R350 Frequency of micturition: Secondary | ICD-10-CM

## 2019-01-08 DIAGNOSIS — R319 Hematuria, unspecified: Secondary | ICD-10-CM

## 2019-01-08 LAB — URINALYSIS, ROUTINE W REFLEX MICROSCOPIC
Bilirubin Urine: NEGATIVE
Glucose, UA: NEGATIVE mg/dL
Ketones, ur: NEGATIVE mg/dL
Nitrite: NEGATIVE
Protein, ur: NEGATIVE mg/dL
Specific Gravity, Urine: 1.008 (ref 1.005–1.030)
pH: 5 (ref 5.0–8.0)

## 2019-01-08 MED ORDER — SULFAMETHOXAZOLE-TRIMETHOPRIM 800-160 MG PO TABS: 1.0000 | ORAL_TABLET | Freq: Two times a day (BID) | ORAL | 0 refills | Status: AC

## 2019-01-08 NOTE — Progress Notes (Signed)
There were no vitals taken for this visit.   Subjective:    Patient ID: Lori Calderon, female    DOB: 01-07-1966, 53 y.o.   MRN: [id number]  HPI: Lori Calderon is a 53 y.o. female presenting on 01/08/2019 for No chief complaint on file.   HPI  This is a telemedicine appointment through [facility] due to coronavirus pandemic  I connected with  Lori Calderon on 01/08/19 by a video enabled telemedicine application and verified that I am speaking with the correct person using two identifiers.   I discussed the limitations of evaluation and management by telemedicine. The patient expressed understanding and agreed to proceed.  Patient is at home.  Provider is at office. .  Patient currently works at [employer].  She says that her supervisor has been having difficulties with allowing her to use the restroom facilities 3 times and her 8-hour shift.  Her supervisor is telling her that she needs to have her urine checked because that is going to the bathroom too many times.  Patient denies dysuria.   Patient is no longer having menstrual cycles.  Relevant past medical, surgical, family and social history reviewed and updated as indicated. Interim medical history since our last visit reviewed. Allergies and medications reviewed and updated.    Current Outpatient Medications:  .  albuterol (VENTOLIN HFA) 108 (90 Base) MCG/ACT inhaler, Inhale 2 puffs into the lungs every 6 (six) hours as needed for wheezing or shortness of breath., Disp: 3 g, Rfl: 1 .  atorvastatin (LIPITOR) 20 MG tablet, Take 1 tablet (20 mg total) by mouth daily., Disp: 90 tablet, Rfl: 2 .  citalopram (CELEXA) 20 MG tablet, Take 1 tablet (20 mg total) by mouth daily., Disp: 30 tablet, Rfl: 3 .  mometasone-formoterol (DULERA) 100-5 MCG/ACT AERO, Inhale 2 puffs into the lungs 2 (two) times daily., Disp: 3 g, Rfl: 1 .  Olopatadine HCl 0.2 % SOLN, Apply 1 drop to eye daily., Disp: 2.5 mL, Rfl: 1 .  omeprazole (PRILOSEC) 40  MG capsule, Take 1 capsule (40 mg total) by mouth daily., Disp: 90 capsule, Rfl: 1   Review of Systems  Per HPI unless specifically indicated above     Objective:    There were no vitals taken for this visit.  Wt Readings from Last 3 Encounters:  05/21/18 250 lb 8 oz (113.6 kg)  11/21/17 253 lb (114.8 kg)  09/20/17 252 lb (114.3 kg)    Physical Exam Constitutional:      General: She is not in acute distress.    Appearance: She is not ill-appearing.  HENT:     Head: Normocephalic and atraumatic.  Pulmonary:     Effort: No respiratory distress.  Neurological:     Mental Status: She is alert and oriented to person, place, and time.  Psychiatric:        Attention and Perception: Attention normal.        Speech: Speech normal.        Behavior: Behavior is cooperative.     Results for orders placed or performed during the hospital encounter of 01/08/19  Urinalysis, Routine w reflex microscopic  Result Value Ref Range   Color, Urine YELLOW YELLOW   APPearance HAZY (A) CLEAR   Specific Gravity, Urine 1.008 1.005 - 1.030   pH 5.0 5.0 - 8.0   Glucose, UA NEGATIVE NEGATIVE mg/dL   Hgb urine dipstick SMALL (A) NEGATIVE   Bilirubin Urine NEGATIVE NEGATIVE   Ketones, ur NEGATIVE  NEGATIVE mg/dL   Protein, ur NEGATIVE NEGATIVE mg/dL   Nitrite NEGATIVE NEGATIVE   Leukocytes,Ua SMALL (A) NEGATIVE   RBC / HPF 0-5 0 - 5 RBC/hpf   WBC, UA 6-10 0 - 5 WBC/hpf   Bacteria, UA RARE (A) NONE SEEN   Squamous Epithelial / LPF 11-20 0 - 5      Assessment & Plan:   Encounter Diagnoses  Name Primary?  . Urinary frequency Yes  . Hematuria, unspecified type     Reviewed labs/urinalysis with patient. We will treat patient with Septra DS for possible cystitis  We will check follow-up urinalysis next time patient is in office.  Patient will be given letter which she can pick up from the office tomorrow for her employer to allow her to use the restroom facilities as needed.  Patient to  follow-up as scheduled.  She is to contact office sooner as needed

## 2019-03-26 ENCOUNTER — Ambulatory Visit: Payer: Self-pay | Attending: Internal Medicine

## 2019-03-26 ENCOUNTER — Other Ambulatory Visit: Payer: Self-pay

## 2019-03-26 DIAGNOSIS — Z20822 Contact with and (suspected) exposure to covid-19: Secondary | ICD-10-CM

## 2019-03-27 ENCOUNTER — Ambulatory Visit: Payer: Self-pay | Admitting: Physician Assistant

## 2019-03-27 LAB — NOVEL CORONAVIRUS, NAA: SARS-CoV-2, NAA: NOT DETECTED

## 2019-03-31 ENCOUNTER — Ambulatory Visit: Payer: Self-pay | Admitting: Physician Assistant

## 2019-04-15 ENCOUNTER — Ambulatory Visit: Payer: Self-pay | Admitting: Physician Assistant

## 2019-04-29 ENCOUNTER — Other Ambulatory Visit: Payer: Self-pay

## 2019-04-29 ENCOUNTER — Ambulatory Visit: Payer: 59 | Attending: Internal Medicine

## 2019-04-29 DIAGNOSIS — Z20822 Contact with and (suspected) exposure to covid-19: Secondary | ICD-10-CM | POA: Insufficient documentation

## 2019-04-30 LAB — NOVEL CORONAVIRUS, NAA: SARS-CoV-2, NAA: NOT DETECTED

## 2019-11-11 ENCOUNTER — Other Ambulatory Visit: Payer: Self-pay

## 2019-11-11 ENCOUNTER — Ambulatory Visit (HOSPITAL_COMMUNITY)
Admission: RE | Admit: 2019-11-11 | Discharge: 2019-11-11 | Disposition: A | Discharge status: Home / Self Care | Payer: 59 | Source: Ambulatory Visit | Attending: Physician Assistant | Admitting: Physician Assistant

## 2019-11-11 ENCOUNTER — Other Ambulatory Visit (HOSPITAL_COMMUNITY): Payer: Self-pay | Admitting: Physician Assistant

## 2019-11-11 DIAGNOSIS — M25862 Other specified joint disorders, left knee: Secondary | ICD-10-CM

## 2019-11-11 DIAGNOSIS — M25562 Pain in left knee: Secondary | ICD-10-CM | POA: Insufficient documentation

## 2019-11-27 ENCOUNTER — Other Ambulatory Visit (HOSPITAL_COMMUNITY): Payer: Self-pay | Admitting: Physician Assistant

## 2019-11-27 ENCOUNTER — Other Ambulatory Visit: Payer: Self-pay

## 2019-11-27 ENCOUNTER — Ambulatory Visit (HOSPITAL_COMMUNITY)
Admission: RE | Admit: 2019-11-27 | Discharge: 2019-11-27 | Disposition: A | Discharge status: Home / Self Care | Payer: 59 | Source: Ambulatory Visit | Attending: Physician Assistant | Admitting: Physician Assistant

## 2019-11-27 DIAGNOSIS — R0602 Shortness of breath: Secondary | ICD-10-CM

## 2020-01-12 ENCOUNTER — Other Ambulatory Visit: Payer: Self-pay

## 2020-01-12 ENCOUNTER — Encounter: Payer: Self-pay | Admitting: Internal Medicine

## 2020-01-12 ENCOUNTER — Other Ambulatory Visit (HOSPITAL_COMMUNITY)
Admission: RE | Admit: 2020-01-12 | Discharge: 2020-01-12 | Disposition: A | Discharge status: Home / Self Care | Payer: 59 | Source: Ambulatory Visit | Attending: Internal Medicine | Admitting: Internal Medicine

## 2020-01-12 ENCOUNTER — Ambulatory Visit (INDEPENDENT_AMBULATORY_CARE_PROVIDER_SITE_OTHER): Payer: 59 | Admitting: Internal Medicine

## 2020-01-12 DIAGNOSIS — R0609 Other forms of dyspnea: Secondary | ICD-10-CM | POA: Insufficient documentation

## 2020-01-12 DIAGNOSIS — J449 Chronic obstructive pulmonary disease, unspecified: Secondary | ICD-10-CM

## 2020-01-12 DIAGNOSIS — R0600 Dyspnea, unspecified: Secondary | ICD-10-CM | POA: Insufficient documentation

## 2020-01-12 LAB — CBC WITH DIFFERENTIAL/PLATELET
Abs Immature Granulocytes: 0.01 10*3/uL (ref 0.00–0.07)
Basophils Absolute: 0.1 10*3/uL (ref 0.0–0.1)
Basophils Relative: 1 %
Eosinophils Absolute: 0.2 10*3/uL (ref 0.0–0.5)
Eosinophils Relative: 3 %
HCT: 42.2 % (ref 36.0–46.0)
Hemoglobin: 13.9 g/dL (ref 12.0–15.0)
Immature Granulocytes: 0 %
Lymphocytes Relative: 31 %
Lymphs Abs: 1.9 10*3/uL (ref 0.7–4.0)
MCH: 29.9 pg (ref 26.0–34.0)
MCHC: 32.9 g/dL (ref 30.0–36.0)
MCV: 90.8 fL (ref 80.0–100.0)
Monocytes Absolute: 0.5 10*3/uL (ref 0.1–1.0)
Monocytes Relative: 8 %
Neutro Abs: 3.5 10*3/uL (ref 1.7–7.7)
Neutrophils Relative %: 57 %
Platelets: 321 10*3/uL (ref 150–400)
RBC: 4.65 MIL/uL (ref 3.87–5.11)
RDW: 13.5 % (ref 11.5–15.5)
WBC: 6 10*3/uL (ref 4.0–10.5)
nRBC: 0 % (ref 0.0–0.2)

## 2020-01-12 LAB — D-DIMER, QUANTITATIVE: D-Dimer, Quant: <0.27 ug/mL-FEU (ref 0.00–0.50)

## 2020-01-12 LAB — BRAIN NATRIURETIC PEPTIDE: B Natriuretic Peptide: 33 pg/mL (ref 0.0–100.0)

## 2020-01-12 LAB — TSH: TSH: 2.078 u[IU]/mL (ref 0.350–4.500)

## 2020-01-12 MED ORDER — BREZTRI AEROSPHERE 160-9-4.8 MCG/ACT IN AERO: 2.0000 | INHALATION_SPRAY | Freq: Two times a day (BID) | RESPIRATORY_TRACT | 11 refills | Status: DC

## 2020-01-12 MED ORDER — BREZTRI AEROSPHERE 160-9-4.8 MCG/ACT IN AERO: 2.0000 | INHALATION_SPRAY | Freq: Two times a day (BID) | RESPIRATORY_TRACT | 0 refills | Status: DC

## 2020-01-12 NOTE — Progress Notes (Signed)
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 Consult    shortness of breath with exertion and rest for 3 years  Dyspnea:   Walking to mailbox flat and gets sob  Back up 3 pillows  Does not have/ struggles to get to store [employer] one aisle  Cough: better since quit smoking/ worse in am Sleep: bed is flat, one pillow  SABA use: twice a daily  rx for prilosec but not taking since no overt HB  No obvious day to day or daytime variability or assoc excess/ purulent sputum or mucus plugs or hemoptysis or cp or chest tightness, subjective wheeze or overt sinus or hb symptoms.   sleeping without nocturnal  or early am exacerbation  of respiratory  c/o's or need for noct saba. Also denies any obvious fluctuation of symptoms with weather or environmental changes or other aggravating or alleviating factors except as outlined above   No unusual exposure hx or h/o childhood pna/ asthma or knowledge of premature birth.  Current Allergies, Complete Past Medical History, Past Surgical History, Family History, and Social History were reviewed in [employer] record.  ROS  The following are not active complaints unless bolded Hoarseness, sore throat, dysphagia, dental problems, itching, sneezing,  nasal congestion or discharge of excess mucus or purulent secretions, ear ache,   fever, chills, sweats, unintended wt loss or wt gain, classically pleuritic or exertional cp,  orthopnea pnd or arm/hand swelling  or leg swelling, presyncope,  palpitations, abdominal pain, anorexia, nausea, vomiting, diarrhea  or change in bowel habits or change in bladder habits, change in stools or change in urine, dysuria, hematuria,  rash, arthralgias, visual complaints, headache, numbness, weakness or ataxia or problems with walking or coordination,  change in mood or  memory.           Past Medical History:  Diagnosis Date  . Asthma   . Depression     Outpatient Medications Prior to Visit  Medication Sig Dispense Refill  . albuterol (VENTOLIN HFA) 108 (90 Base) MCG/ACT inhaler Inhale 2 puffs into the lungs every 6 (six) hours as needed for wheezing or shortness of breath. 3 g 1  . atorvastatin (LIPITOR) 20 MG tablet Take 1 tablet (20 mg total) by mouth daily. 90 tablet 2  . citalopram (CELEXA) 20 MG tablet Take 1 tablet (20 mg total) by mouth daily. 30 tablet 3  . Olopatadine HCl 0.2 % SOLN Apply 1 drop to eye daily. 2.5 mL 1  . mometasone-formoterol (DULERA) 100-5 MCG/ACT AERO Inhale 2 puffs into the lungs 2 (two) times daily. (Patient not taking: Reported on 01/12/2020) 3 g 1  . omeprazole (PRILOSEC) 40 MG capsule Take 1 capsule (40 mg total) by mouth daily. (Patient not taking: Reported on 01/12/2020) 90 capsule 1   No facility-administered medications prior to visit.     Objective:     BP 140/78 (BP Location: Left Arm, Cuff Size: Normal)   Pulse 94  Temp (!) 97.3 F (36.3 C) (Other (Comment)) Comment (Src): wrist  Ht 5\' 5"  (1.651 m)   Wt 266 lb (120.7 kg)   SpO2 96% Comment: Room air  BMI 44.26 kg/m   SpO2: 96 % (Room air)   Somber obese amb wf nad  HEENT : pt wearing mask not removed for exam due to covid - 19 concerns.   NECK :  without JVD/Nodes/TM/ nl carotid upstrokes bilaterally   LUNGS: no acc muscle use,  Min barrel  contour chest wall with bilateral  slightly decreased bs s audible wheeze and  without cough on insp or exp maneuvers and min  Hyperresonant  to  percussion bilaterally     CV:  RRR  no s3 or  murmur or increase in P2, and no edema   ABD: obese  soft and nontender with pos end  insp Hoover's  in the supine position. No bruits or organomegaly appreciated, bowel sounds nl  MS:   Nl gait/  ext warm without deformities, calf tenderness, cyanosis or clubbing No obvious joint restrictions   SKIN: warm and dry without lesions    NEURO:  alert, approp, nl sensorium with  no motor or cerebellar deficits apparent.        I personally reviewed images and agree with radiology impression as follows:  CXR:   11/27/19 No active cardiopulmonary disease.  Labs ordered/ reviewed:      Chemistry      Component Value Date/Time   NA 139 11/29/2018 0813   K 4.3 11/29/2018 0813   CL 105 11/29/2018 0813   CO2 25 11/29/2018 0813   BUN 15 11/29/2018 0813   CREATININE 0.85 11/29/2018 0813   CREATININE 0.86 05/06/2016 0930      Component Value Date/Time   CALCIUM 8.7 (L) 11/29/2018 0813   ALKPHOS 77 11/29/2018 0813   AST 23 11/29/2018 0813   ALT 25 11/29/2018 0813   BILITOT 0.5 11/29/2018 0813        Lab Results  Component Value Date   WBC 6.0 01/12/2020   HGB 13.9 01/12/2020   HCT 42.2 01/12/2020   MCV 90.8 01/12/2020   PLT 321 01/12/2020     Lab Results  Component Value Date   DDIMER <0.27 01/12/2020      Lab Results  Component Value Date   TSH 2.078 01/12/2020            Assessment   COPD GOLD ?  Quit smoking 09/2019 - 01/12/2020  After extensive coaching inhaler device,  effectiveness =    75% short Ti ) > trial of breztri before return for pfts -  01/12/2020   Walked RA  approx   300 ft  @ avg pace  stopped due to  Sob with sats still 97%    - Labs ordered 01/12/2020  :  allergy profile   alpha one AT phenotype   If this is copd and she's failed laba/ics, needs tril of breztri (adding in the [place])  2bid pending pfts but I suspect most of her problem is related to obesity and deconditioning     DOE (dyspnea on exertion) -  01/12/2020   Walked RA  approx   300  ft  @ avg pace  stopped due to  13/03/2019 with sats still 97%   No evidence of anemia, chf, cri or thryoid abn, dvt or PE based on today's labs    Morbid obesity due to excess calories (HCC) Body mass index is 44.26  kg/m.   Lab Results  Component Value Date   TSH 2.078 01/12/2020     Contributing to gerd risk/ doe/reviewed the need and the process to achieve and maintain neg calorie balance > defer f/u primary care including intermittently monitoring thyroid status      Each maintenance medication was reviewed in detail including emphasizing most importantly the difference between maintenance and prns and under what circumstances the prns are to be triggered using an action plan format where appropriate.  Total time for H and P, chart review, counseling, teaching device/  directly observing portions of ambulatory 02 saturation study/  and generating customized AVS unique to this office visit / charting =  55 min         Sandrea Hughs, MD 01/12/2020

## 2020-01-12 NOTE — Patient Instructions (Addendum)
Plan A = Automatic = Always=    Breztri Take 2 puffs first thing in am and then another 2 puffs about 12 hours later.  Work on inhaler technique:  relax and gently blow all the way out then take a nice smooth deep breath back in, triggering the inhaler at same time you start breathing in.  Hold for up to 5 seconds if you can. Blow out thru nose. Rinse and gargle with water when done. Brush teeth/ tongue and gargle with arm and hammer toothpaste     Plan B = Backup (to supplement plan A, not to replace it) Only use your albuterol inhaler as a rescue medication to be used if you can't catch your breath by resting or doing a relaxed purse lip breathing pattern.  - The less you use it, the better it will work when you need it. - Ok to use the inhaler up to 2 puffs  every 4 hours if you must but call for appointment if use goes up over your usual need - Don't leave home without it !!  (think of it like the spare tire for your car)    Please remember to go to the lab department @ [facility] for your tests - we will call you with the results when they are available.       Please schedule a follow up office visit in 6 weeks  with PFTs on return

## 2020-01-12 NOTE — Assessment & Plan Note (Addendum)
-    01/12/2020   Walked RA  approx   300 ft  @ avg pace  stopped due to  Sob with sats still 97%   No evidence of anemia, chf, cri or thryoid abn, dvt or PE based on today's labs

## 2020-01-12 NOTE — Assessment & Plan Note (Addendum)
Quit smoking 09/2019 - 01/12/2020  After extensive coaching inhaler device,  effectiveness =    75% short Ti ) > trial of breztri before return for pfts -  01/12/2020   Walked RA  approx   300 ft  @ avg pace  stopped due to  Sob with sats still 97%    - Labs ordered 01/12/2020  :  allergy profile   alpha one AT phenotype   If this is copd and she's failed laba/ics, needs tril of breztri (adding in the [place])  2bid pending pfts but I suspect most of her problem is related to obesity and deconditioning

## 2020-01-12 NOTE — Assessment & Plan Note (Signed)
Body mass index is 44.26 kg/m.   Lab Results  Component Value Date   TSH 2.078 01/12/2020     Contributing to gerd risk/ doe/reviewed the need and the process to achieve and maintain neg calorie balance > defer f/u primary care including intermittently monitoring thyroid status            Each maintenance medication was reviewed in detail including emphasizing most importantly the difference between maintenance and prns and under what circumstances the prns are to be triggered using an action plan format where appropriate.  Total time for H and P, chart review, counseling, teaching device/  directly observing portions of ambulatory 02 saturation study/  and generating customized AVS unique to this office visit / charting =  55 min

## 2020-01-13 LAB — ALPHA-1 ANTITRYPSIN PHENOTYPE: A-1 Antitrypsin, Ser: 122 mg/dL (ref 101–187)

## 2020-01-14 LAB — IGE: IgE (Immunoglobulin E), Serum: 33 IU/mL (ref 6–495)

## 2021-04-05 ENCOUNTER — Other Ambulatory Visit: Payer: Self-pay

## 2021-04-05 ENCOUNTER — Other Ambulatory Visit (HOSPITAL_COMMUNITY): Payer: Self-pay | Admitting: Internal Medicine

## 2021-04-05 ENCOUNTER — Ambulatory Visit (HOSPITAL_COMMUNITY)
Admission: RE | Admit: 2021-04-05 | Discharge: 2021-04-05 | Disposition: A | Discharge status: Home / Self Care | Payer: 59 | Source: Ambulatory Visit | Attending: Internal Medicine | Admitting: Internal Medicine

## 2021-04-05 DIAGNOSIS — R059 Cough, unspecified: Secondary | ICD-10-CM | POA: Insufficient documentation

## 2021-11-04 ENCOUNTER — Encounter (INDEPENDENT_AMBULATORY_CARE_PROVIDER_SITE_OTHER): Payer: Self-pay | Admitting: *Deleted

## 2021-12-15 ENCOUNTER — Encounter: Payer: Self-pay | Admitting: Adult Health

## 2021-12-15 ENCOUNTER — Other Ambulatory Visit (HOSPITAL_COMMUNITY)
Admission: RE | Admit: 2021-12-15 | Discharge: 2021-12-15 | Disposition: A | Discharge status: Home / Self Care | Payer: PRIVATE HEALTH INSURANCE | Source: Ambulatory Visit | Attending: Adult Health | Admitting: Adult Health

## 2021-12-15 ENCOUNTER — Ambulatory Visit (INDEPENDENT_AMBULATORY_CARE_PROVIDER_SITE_OTHER): Payer: PRIVATE HEALTH INSURANCE | Admitting: Adult Health

## 2021-12-15 VITALS — BP 133/60 | HR 88 | Ht 65.0 in | Wt 286.2 lb

## 2021-12-15 DIAGNOSIS — Z1231 Encounter for screening mammogram for malignant neoplasm of breast: Secondary | ICD-10-CM | POA: Diagnosis not present

## 2021-12-15 DIAGNOSIS — Z01419 Encounter for gynecological examination (general) (routine) without abnormal findings: Secondary | ICD-10-CM | POA: Insufficient documentation

## 2021-12-15 DIAGNOSIS — Z1212 Encounter for screening for malignant neoplasm of rectum: Secondary | ICD-10-CM

## 2021-12-15 DIAGNOSIS — Z1211 Encounter for screening for malignant neoplasm of colon: Secondary | ICD-10-CM | POA: Diagnosis not present

## 2021-12-15 DIAGNOSIS — K649 Unspecified hemorrhoids: Secondary | ICD-10-CM | POA: Diagnosis not present

## 2021-12-15 LAB — HEMOCCULT GUIAC POC 1CARD (OFFICE): Fecal Occult Blood, POC: NEGATIVE

## 2021-12-15 NOTE — Progress Notes (Signed)
Patient ID: Lori Calderon, female   DOB: 07-02-1965, 56 y.o.   MRN: [id number] History of Present Illness: Lori Calderon is a 56 year old white female, divorced, PM in for a well woman gyn exam and pap. Last pap in 2017.  PCP is Dr Sherwood Gambler.   Current Medications, Allergies, Past Medical History, Past Surgical History, Family History and Social History were reviewed in [employer] electronic medical record.     Review of Systems: Patient denies any headaches, hearing loss, fatigue, blurred vision, shortness of breath, chest pain, abdominal pain, problems with bowel movements, or intercourse(not having sex). No joint pain or mood swings. Denies any vaginal bleeding Has urinary frequency at times, declines meds   Physical Exam:BP 133/60 (BP Location: Left Arm, Patient Position: Sitting, Cuff Size: Normal)   Pulse 88   Ht 5\' 5"  (1.651 m)   Wt 286 lb 3.2 oz (129.8 kg)   BMI 47.63 kg/m   General:  Well developed, well nourished, no acute distress Skin:  Warm and dry Neck:  Midline trachea, normal thyroid, good ROM, no lymphadenopathy Lungs; Clear to auscultation bilaterally Breast:  No dominant palpable mass, retraction, or nipple discharge Cardiovascular: Regular rate and rhythm Abdomen:  Soft, non tender, no hepatosplenomegaly,obese Pelvic:  External genitalia is normal in appearance, no lesions.  The vagina is pale with loss of moisture and rugae. Urethra has no lesions or masses. The cervix is bulbous, pap with HR HPV genotyping performed.  Uterus is felt to be normal size, shape, and contour.  No adnexal masses or tenderness noted.Bladder is non tender, no masses felt. Rectal: Good sphincter tone, no polyps, + hemorrhoids felt.  Hemoccult negative. Extremities/musculoskeletal:  No swelling or varicosities noted, no clubbing or cyanosis Psych:  No mood changes, alert and cooperative,seems happy AA is 1 Fall risk is low    12/15/2021   10:44 AM 01/03/2016   12:51 PM 12/13/2015    9:47  AM  Depression screen PHQ 2/9  Decreased Interest 1 0 3  Down, Depressed, Hopeless 1 1 3   PHQ - 2 Score 2 1 6   Altered sleeping 0 1 3  Tired, decreased energy 1 1 3   Change in appetite 1 2 3   Feeling bad or failure about yourself  0 0 3  Trouble concentrating 0 0 2  Moving slowly or fidgety/restless 0 0 0  Suicidal thoughts 0 0 0  PHQ-9 Score 4 5 20    On Prozac    12/15/2021   10:44 AM 01/03/2016   12:52 PM 12/13/2015    9:47 AM  GAD 7 : Generalized Anxiety Score  Nervous, Anxious, on Edge 0 1 1  Control/stop worrying 0 1 3  Worry too much - different things 0 1 3  Trouble relaxing 0 0 3  Restless 1 1 2   Easily annoyed or irritable 1 2 3   Afraid - awful might happen 0 0 1  Total GAD 7 Score 2 6 16   Anxiety Difficulty  Somewhat difficult Somewhat difficult    Upstream - 12/15/21 1044       Pregnancy Intention Screening   Does the patient want to become pregnant in the next year? No    Does the patient's partner want to become pregnant in the next year? No    Would the patient like to discuss contraceptive options today? No      Contraception Wrap Up   Current Method Female Sterilization    End Method Female Sterilization    Contraception Counseling Provided  No            Examination chaperoned by Levy Pupa LPN    Impression and Plan: 1. Encounter for gynecological examination with Papanicolaou smear of cervix Pap sent Pap in 3 years if normal - Cytology - PAP( [facility]) Physical with PCP Labs with PCP  2. Encounter for screening fecal occult blood testing Hemoccult was negative  - POCT occult blood stool  3. Hemorrhoids, unspecified hemorrhoid type  4. Screening for colorectal cancer Has never had colonoscopy or cologuard Will order cologuard  - Cologuard  5. Screening mammogram for breast cancer Mammogram scheduled for her 12/21/21 at 11:45 am at [place]; Future  6. Morbid obesity ([place]) Try to increase  walking

## 2021-12-19 LAB — CYTOLOGY - PAP
Adequacy: ABSENT
Comment: NEGATIVE
Diagnosis: NEGATIVE
High risk HPV: NEGATIVE

## 2021-12-21 ENCOUNTER — Ambulatory Visit (HOSPITAL_COMMUNITY): Payer: PRIVATE HEALTH INSURANCE

## 2022-01-09 ENCOUNTER — Other Ambulatory Visit (HOSPITAL_COMMUNITY): Payer: Self-pay | Admitting: Internal Medicine

## 2022-01-09 DIAGNOSIS — R7989 Other specified abnormal findings of blood chemistry: Secondary | ICD-10-CM

## 2022-03-08 ENCOUNTER — Ambulatory Visit (HOSPITAL_COMMUNITY): Payer: PRIVATE HEALTH INSURANCE

## 2022-04-14 ENCOUNTER — Encounter (INDEPENDENT_AMBULATORY_CARE_PROVIDER_SITE_OTHER): Payer: Self-pay | Admitting: *Deleted

## 2022-11-15 ENCOUNTER — Other Ambulatory Visit (HOSPITAL_COMMUNITY)
Admission: RE | Admit: 2022-11-15 | Discharge: 2022-11-15 | Disposition: A | Discharge status: Home / Self Care | Payer: Medicaid Other | Source: Ambulatory Visit | Attending: Internal Medicine | Admitting: Internal Medicine

## 2022-11-15 ENCOUNTER — Other Ambulatory Visit (HOSPITAL_COMMUNITY): Payer: Self-pay | Admitting: Internal Medicine

## 2022-11-15 ENCOUNTER — Ambulatory Visit (HOSPITAL_COMMUNITY)
Admission: RE | Admit: 2022-11-15 | Discharge: 2022-11-15 | Disposition: A | Discharge status: Home / Self Care | Payer: Medicaid Other | Source: Ambulatory Visit | Attending: Internal Medicine | Admitting: Internal Medicine

## 2022-11-15 DIAGNOSIS — R0789 Other chest pain: Secondary | ICD-10-CM | POA: Insufficient documentation

## 2022-11-15 DIAGNOSIS — R0600 Dyspnea, unspecified: Secondary | ICD-10-CM | POA: Diagnosis present

## 2022-11-15 DIAGNOSIS — Z0001 Encounter for general adult medical examination with abnormal findings: Secondary | ICD-10-CM | POA: Insufficient documentation

## 2022-11-15 DIAGNOSIS — R7309 Other abnormal glucose: Secondary | ICD-10-CM | POA: Insufficient documentation

## 2022-11-15 LAB — LIPID PANEL
Cholesterol: 186 mg/dL (ref 0–200)
HDL: 35 mg/dL — ABNORMAL LOW (ref >40)
LDL Cholesterol: 110 mg/dL — ABNORMAL HIGH (ref 0–99)
Total CHOL/HDL Ratio: 5.3 ratio
Triglycerides: 203 mg/dL — ABNORMAL HIGH (ref <150)
VLDL: 41 mg/dL — ABNORMAL HIGH (ref 0–40)

## 2022-11-15 LAB — COMPREHENSIVE METABOLIC PANEL
ALT: 45 U/L — ABNORMAL HIGH (ref 0–44)
AST: 54 U/L — ABNORMAL HIGH (ref 15–41)
Albumin: 3.7 g/dL (ref 3.5–5.0)
Alkaline Phosphatase: 88 U/L (ref 38–126)
Anion gap: 11 (ref 5–15)
BUN: 15 mg/dL (ref 6–20)
CO2: 23 mmol/L (ref 22–32)
Calcium: 8.9 mg/dL (ref 8.9–10.3)
Chloride: 100 mmol/L (ref 98–111)
Creatinine, Ser: 0.84 mg/dL (ref 0.44–1.00)
GFR, Estimated: >60 mL/min (ref >60)
Glucose, Bld: 290 mg/dL — ABNORMAL HIGH (ref 70–99)
Potassium: 3.9 mmol/L (ref 3.5–5.1)
Sodium: 132 mmol/L — ABNORMAL LOW (ref 135–145)
Total Bilirubin: 0.7 mg/dL (ref 0.3–1.2)
Total Protein: 7.3 g/dL (ref 6.5–8.1)

## 2022-11-15 LAB — VITAMIN D 25 HYDROXY (VIT D DEFICIENCY, FRACTURES): Vit D, 25-Hydroxy: 30.16 ng/mL (ref 30–100)

## 2022-11-15 LAB — HEMOGLOBIN A1C
Hgb A1c MFr Bld: 10 % — ABNORMAL HIGH (ref 4.8–5.6)
Mean Plasma Glucose: 240.3 mg/dL

## 2022-11-15 LAB — CBC
HCT: 39.9 % (ref 36.0–46.0)
Hemoglobin: 13.4 g/dL (ref 12.0–15.0)
MCH: 29.9 pg (ref 26.0–34.0)
MCHC: 33.6 g/dL (ref 30.0–36.0)
MCV: 89.1 fL (ref 80.0–100.0)
Platelets: 256 10*3/uL (ref 150–400)
RBC: 4.48 MIL/uL (ref 3.87–5.11)
RDW: 13.4 % (ref 11.5–15.5)
WBC: 5.4 10*3/uL (ref 4.0–10.5)
nRBC: 0 % (ref 0.0–0.2)

## 2022-11-15 LAB — FOLATE: Folate: 21.8 ng/mL (ref >5.9)

## 2022-11-15 LAB — BRAIN NATRIURETIC PEPTIDE: B Natriuretic Peptide: 18 pg/mL (ref 0.0–100.0)

## 2022-11-15 LAB — CK TOTAL AND CKMB (NOT AT ARMC)
CK, MB: 1.3 ng/mL (ref 0.5–5.0)
Total CK: 81 U/L (ref 38–234)

## 2022-11-15 LAB — TROPONIN I (HIGH SENSITIVITY): Troponin I (High Sensitivity): 2 ng/L (ref <18)

## 2022-11-15 LAB — VITAMIN B12: Vitamin B-12: 468 pg/mL (ref 180–914)

## 2022-11-15 LAB — TSH: TSH: 2.699 u[IU]/mL (ref 0.350–4.500)

## 2022-11-16 LAB — DIFFERENTIAL
Abs Immature Granulocytes: 0 10*3/uL (ref 0.00–0.07)
Basophils Absolute: 0 10*3/uL (ref 0.0–0.1)
Basophils Relative: 1 %
Eosinophils Absolute: 0.2 10*3/uL (ref 0.0–0.5)
Eosinophils Relative: 3 %
Immature Granulocytes: 0 %
Lymphocytes Relative: 28 %
Lymphs Abs: 1.5 10*3/uL (ref 0.7–4.0)
Monocytes Absolute: 0.3 10*3/uL (ref 0.1–1.0)
Monocytes Relative: 5 %
Neutro Abs: 3.4 10*3/uL (ref 1.7–7.7)
Neutrophils Relative %: 63 %

## 2022-11-16 LAB — MICROALBUMIN / CREATININE URINE RATIO
Creatinine, Urine: 91.6 mg/dL
Microalb Creat Ratio: 20 mg/g{creat} (ref 0–29)
Microalb, Ur: 18.1 ug/mL — ABNORMAL HIGH

## 2023-04-23 IMAGING — CR DG CHEST 2V
2 series · 2 of 2 positions shown · non-contrast
Comparison: November 27, 2019

CLINICAL DATA: Chest pain and shortness of breath x1 month.

EXAM:
CHEST - 2 VIEW

[w pa chest]
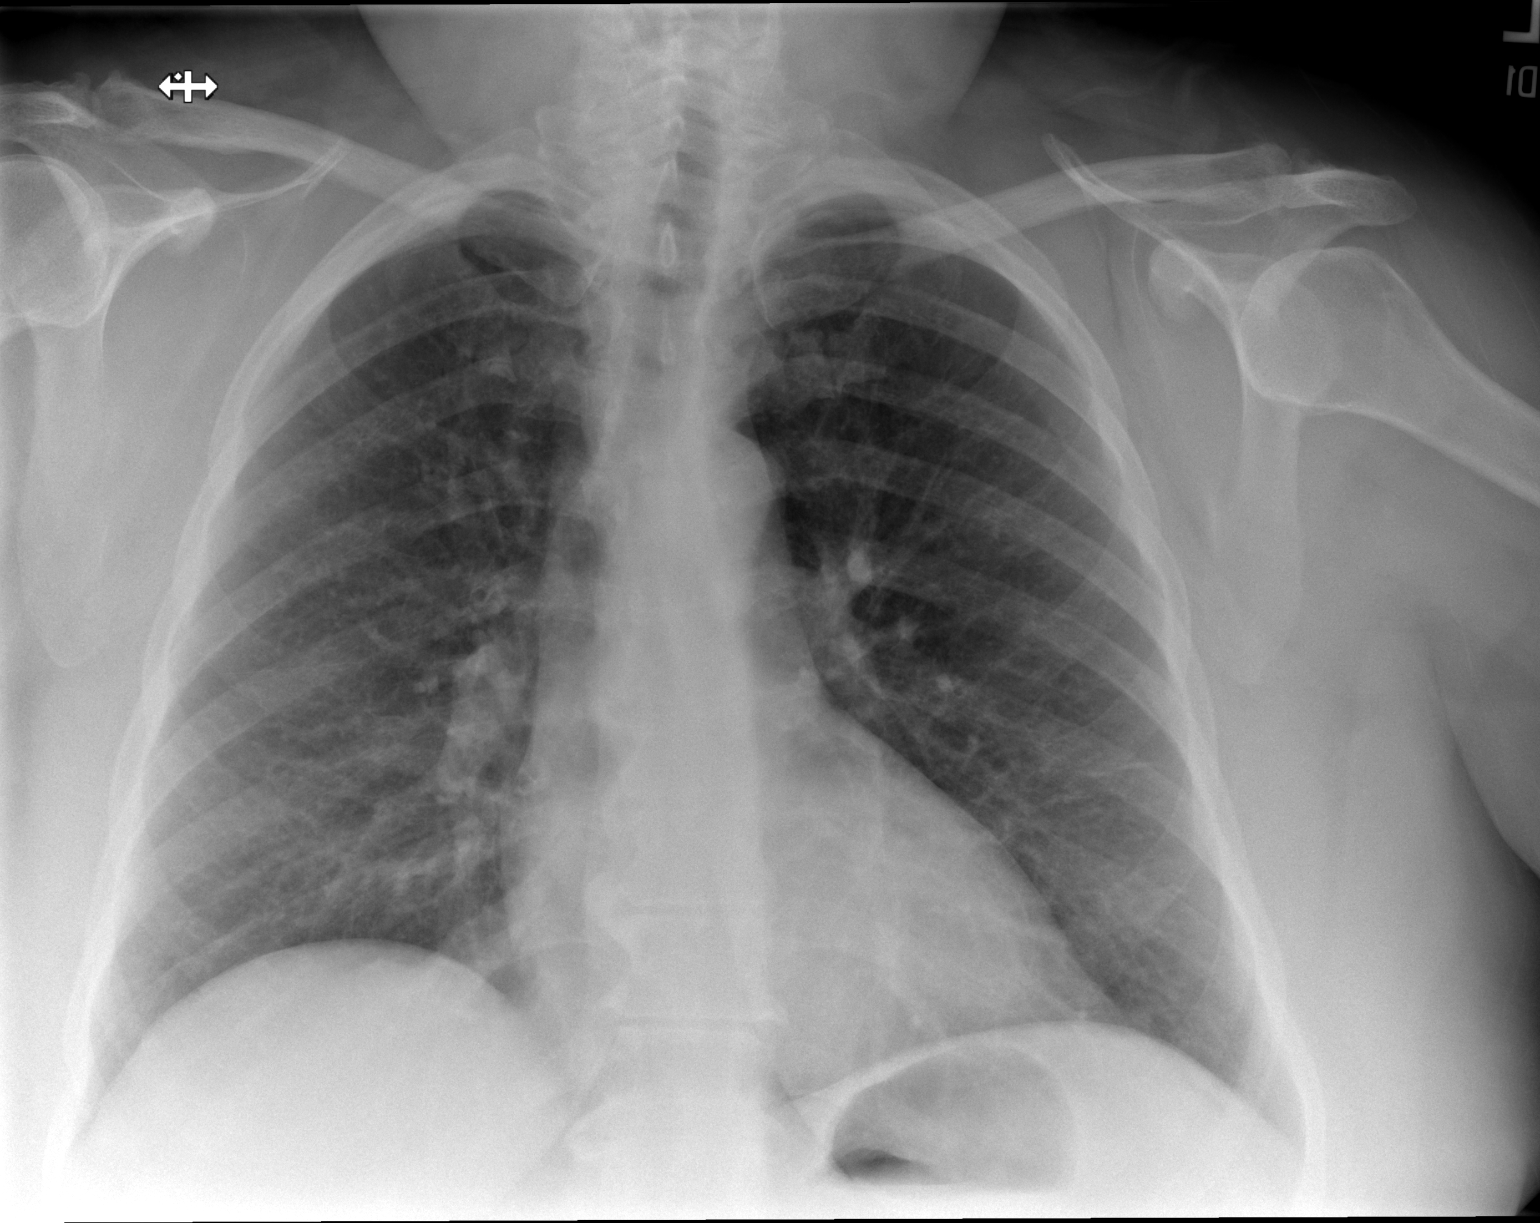

[w chest lat]
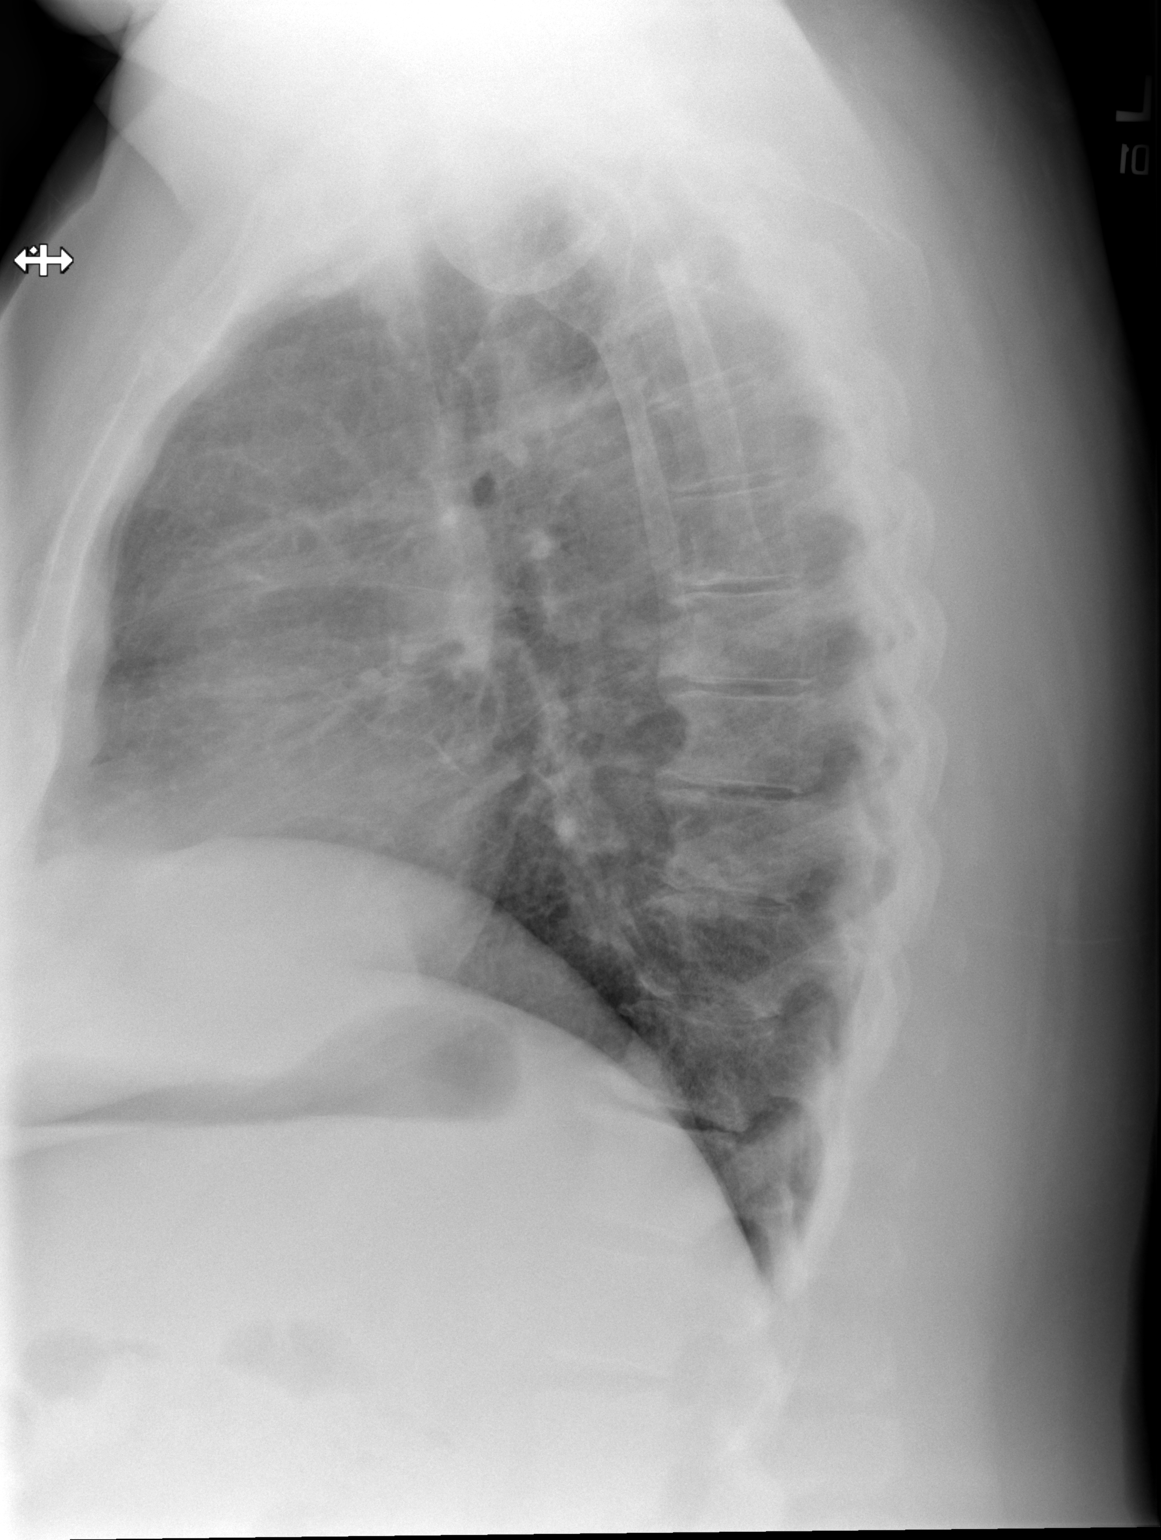

[2 of 2 positions shown; findings below may reference images not displayed]

FINDINGS: The heart size and mediastinal contours are within normal limits.
Both lungs are clear. Degenerative changes are seen within the mid
and lower thoracic spine.
IMPRESSION: No active cardiopulmonary disease.

## 2023-06-20 ENCOUNTER — Encounter (HOSPITAL_COMMUNITY): Payer: Self-pay

## 2023-06-20 ENCOUNTER — Emergency Department (HOSPITAL_COMMUNITY)
Admission: EM | Admit: 2023-06-20 | Discharge: 2023-06-20 | Disposition: A | Discharge status: Home / Self Care | Attending: Emergency Medicine | Admitting: Emergency Medicine

## 2023-06-20 ENCOUNTER — Other Ambulatory Visit (HOSPITAL_COMMUNITY)
Admission: RE | Admit: 2023-06-20 | Discharge: 2023-06-20 | Disposition: A | Discharge status: Home / Self Care | Source: Ambulatory Visit | Attending: Internal Medicine | Admitting: Internal Medicine

## 2023-06-20 ENCOUNTER — Other Ambulatory Visit: Payer: Self-pay

## 2023-06-20 DIAGNOSIS — J439 Emphysema, unspecified: Secondary | ICD-10-CM | POA: Diagnosis present

## 2023-06-20 DIAGNOSIS — R739 Hyperglycemia, unspecified: Secondary | ICD-10-CM | POA: Insufficient documentation

## 2023-06-20 DIAGNOSIS — R5383 Other fatigue: Secondary | ICD-10-CM | POA: Diagnosis present

## 2023-06-20 HISTORY — DX: Carpal tunnel syndrome, unspecified upper limb: G56.00

## 2023-06-20 HISTORY — DX: Unspecified osteoarthritis, unspecified site: M19.90

## 2023-06-20 HISTORY — DX: Primary generalized (osteo)arthritis: M15.0

## 2023-06-20 LAB — CBC
HCT: 43 % (ref 36.0–46.0)
Hemoglobin: 14.2 g/dL (ref 12.0–15.0)
MCH: 29 pg (ref 26.0–34.0)
MCHC: 33 g/dL (ref 30.0–36.0)
MCV: 87.8 fL (ref 80.0–100.0)
Platelets: 275 10*3/uL (ref 150–400)
RBC: 4.9 MIL/uL (ref 3.87–5.11)
RDW: 13.5 % (ref 11.5–15.5)
WBC: 5.2 10*3/uL (ref 4.0–10.5)
nRBC: 0 % (ref 0.0–0.2)

## 2023-06-20 LAB — URINALYSIS, ROUTINE W REFLEX MICROSCOPIC
Bilirubin Urine: NEGATIVE
Glucose, UA: >=500 mg/dL — AB
Ketones, ur: NEGATIVE mg/dL
Leukocytes,Ua: NEGATIVE
Nitrite: NEGATIVE
Protein, ur: NEGATIVE mg/dL
Specific Gravity, Urine: 1.032 — ABNORMAL HIGH (ref 1.005–1.030)
pH: 5 (ref 5.0–8.0)

## 2023-06-20 LAB — CBC WITH DIFFERENTIAL/PLATELET
Abs Immature Granulocytes: 0.01 10*3/uL (ref 0.00–0.07)
Basophils Absolute: 0 10*3/uL (ref 0.0–0.1)
Basophils Relative: 1 %
Eosinophils Absolute: 0.1 10*3/uL (ref 0.0–0.5)
Eosinophils Relative: 3 %
HCT: 42.9 % (ref 36.0–46.0)
Hemoglobin: 13.8 g/dL (ref 12.0–15.0)
Immature Granulocytes: 0 %
Lymphocytes Relative: 27 %
Lymphs Abs: 1.2 10*3/uL (ref 0.7–4.0)
MCH: 28.3 pg (ref 26.0–34.0)
MCHC: 32.2 g/dL (ref 30.0–36.0)
MCV: 88.1 fL (ref 80.0–100.0)
Monocytes Absolute: 0.3 10*3/uL (ref 0.1–1.0)
Monocytes Relative: 6 %
Neutro Abs: 2.8 10*3/uL (ref 1.7–7.7)
Neutrophils Relative %: 63 %
Platelets: 258 10*3/uL (ref 150–400)
RBC: 4.87 MIL/uL (ref 3.87–5.11)
RDW: 13.5 % (ref 11.5–15.5)
WBC: 4.4 10*3/uL (ref 4.0–10.5)
nRBC: 0 % (ref 0.0–0.2)

## 2023-06-20 LAB — COMPREHENSIVE METABOLIC PANEL WITH GFR
ALT: 38 U/L (ref 0–44)
ALT: 41 U/L (ref 0–44)
AST: 36 U/L (ref 15–41)
AST: 38 U/L (ref 15–41)
Albumin: 3.5 g/dL (ref 3.5–5.0)
Albumin: 3.8 g/dL (ref 3.5–5.0)
Alkaline Phosphatase: 95 U/L (ref 38–126)
Alkaline Phosphatase: 104 U/L (ref 38–126)
Anion gap: 13 (ref 5–15)
Anion gap: 13 (ref 5–15)
BUN: 15 mg/dL (ref 6–20)
BUN: 14 mg/dL (ref 6–20)
CO2: 23 mmol/L (ref 22–32)
CO2: 23 mmol/L (ref 22–32)
Calcium: 9.1 mg/dL (ref 8.9–10.3)
Calcium: 9.3 mg/dL (ref 8.9–10.3)
Chloride: 96 mmol/L — ABNORMAL LOW (ref 98–111)
Chloride: 95 mmol/L — ABNORMAL LOW (ref 98–111)
Creatinine, Ser: 0.83 mg/dL (ref 0.44–1.00)
Creatinine, Ser: 0.79 mg/dL (ref 0.44–1.00)
GFR, Estimated: >60 mL/min (ref >60)
GFR, Estimated: >60 mL/min (ref >60)
Glucose, Bld: 528 mg/dL (ref 70–99)
Glucose, Bld: 518 mg/dL (ref 70–99)
Potassium: 4.4 mmol/L (ref 3.5–5.1)
Potassium: 4.1 mmol/L (ref 3.5–5.1)
Sodium: 132 mmol/L — ABNORMAL LOW (ref 135–145)
Sodium: 131 mmol/L — ABNORMAL LOW (ref 135–145)
Total Bilirubin: 0.7 mg/dL (ref 0.0–1.2)
Total Bilirubin: 0.3 mg/dL (ref 0.0–1.2)
Total Protein: 7.4 g/dL (ref 6.5–8.1)
Total Protein: 8 g/dL (ref 6.5–8.1)

## 2023-06-20 LAB — LIPID PANEL
Cholesterol: 177 mg/dL (ref 0–200)
HDL: 33 mg/dL — ABNORMAL LOW (ref >40)
LDL Cholesterol: 97 mg/dL (ref 0–99)
Total CHOL/HDL Ratio: 5.4 ratio
Triglycerides: 235 mg/dL — ABNORMAL HIGH (ref <150)
VLDL: 47 mg/dL — ABNORMAL HIGH (ref 0–40)

## 2023-06-20 LAB — I-STAT CHEM 8, ED
BUN: 14 mg/dL (ref 6–20)
Calcium, Ion: 1.18 mmol/L (ref 1.15–1.40)
Chloride: 99 mmol/L (ref 98–111)
Creatinine, Ser: 0.7 mg/dL (ref 0.44–1.00)
Glucose, Bld: 526 mg/dL (ref 70–99)
HCT: 45 % (ref 36.0–46.0)
Hemoglobin: 15.3 g/dL — ABNORMAL HIGH (ref 12.0–15.0)
Potassium: 4.4 mmol/L (ref 3.5–5.1)
Sodium: 134 mmol/L — ABNORMAL LOW (ref 135–145)
TCO2: 24 mmol/L (ref 22–32)

## 2023-06-20 LAB — BLOOD GAS, VENOUS
Acid-base deficit: 0.5 mmol/L (ref 0.0–2.0)
Bicarbonate: 24.2 mmol/L (ref 20.0–28.0)
Drawn by: 51519
O2 Saturation: 85.5 %
Patient temperature: 36.9
pCO2, Ven: 39 mmHg — ABNORMAL LOW (ref 44–60)
pH, Ven: 7.4 (ref 7.25–7.43)
pO2, Ven: 53 mmHg — ABNORMAL HIGH (ref 32–45)

## 2023-06-20 LAB — TSH: TSH: 1.954 u[IU]/mL (ref 0.350–4.500)

## 2023-06-20 LAB — BETA-HYDROXYBUTYRIC ACID: Beta-Hydroxybutyric Acid: 0.1 mmol/L (ref 0.05–0.27)

## 2023-06-20 LAB — HEMOGLOBIN A1C
Hgb A1c MFr Bld: 12 % — ABNORMAL HIGH (ref 4.8–5.6)
Mean Plasma Glucose: 297.7 mg/dL

## 2023-06-20 LAB — CBG MONITORING, ED
Glucose-Capillary: 547 mg/dL (ref 70–99)
Glucose-Capillary: 388 mg/dL — ABNORMAL HIGH (ref 70–99)
Glucose-Capillary: 314 mg/dL — ABNORMAL HIGH (ref 70–99)

## 2023-06-20 LAB — VITAMIN D 25 HYDROXY (VIT D DEFICIENCY, FRACTURES): Vit D, 25-Hydroxy: 25.88 ng/mL — ABNORMAL LOW (ref 30–100)

## 2023-06-20 MED ORDER — INSULIN ASPART 100 UNIT/ML IJ SOLN
15.0000 [IU] | Freq: Once | INTRAMUSCULAR | Status: AC
  Administered 2023-06-20: 15 [IU] via SUBCUTANEOUS
  Filled 2023-06-20: qty 1

## 2023-06-20 MED ORDER — LACTATED RINGERS IV BOLUS
1000.0000 mL | Freq: Once | INTRAVENOUS | Status: AC
  Administered 2023-06-20: 1000 mL via INTRAVENOUS

## 2023-06-20 NOTE — Discharge Instructions (Signed)
 Please follow-up closely with your primary care doctor on an outpatient basis.  Please start taking your metformin as prescribed.  Return to emergency department immediately for any new or worsening symptoms.

## 2023-06-20 NOTE — ED Triage Notes (Signed)
 Pt arrived due to her Dr called her and told her to come and get insulin due to high sugar. Nurse looked in noted and saw cbg 528. Now CBG 547.

## 2023-06-20 NOTE — ED Provider Notes (Signed)
 [facility] AT [facility] Provider Note   CSN: [id number] Arrival date & time: 06/20/23  1236     History  Chief Complaint  Patient presents with   Hyperglycemia    Lori Calderon is a 58 y.o. female.  Patient is a 58 year old female who presents to the emergency department chief complaint of elevated blood sugar.  Patient notes that she has not been monitoring her blood sugar at home and has not been taking her metformin.  Patient does note that she has had some generalized weakness as well as some intermittent abdominal discomfort but denies any nausea, vomiting, diarrhea.  She has had no associated chest pain or shortness of breath.  She denies any fever or chills.   Hyperglycemia Associated symptoms: fatigue        Home Medications Prior to Admission medications   Medication Sig Start Date End Date Taking? Authorizing Provider  albuterol (VENTOLIN HFA) 108 (90 Base) MCG/ACT inhaler Inhale 2 puffs into the lungs every 6 (six) hours as needed for wheezing or shortness of breath. 12/30/18   Jacquelin Hawking, PA-C  FLUoxetine (PROZAC) 40 MG capsule Take 40 mg by mouth daily. 11/10/21   [provider]      Allergies    Keflex [cephalexin]    Review of Systems   Review of Systems  Constitutional:  Positive for fatigue.  All other systems reviewed and are negative.   Physical Exam Updated Vital Signs BP (!) 150/74 (BP Location: Right Arm)   Pulse 90   Temp 98.4 F (36.9 C) (Oral)   Resp 20   Ht 5\' 5"  (1.651 m)   Wt 125.6 kg   SpO2 98%   BMI 46.10 kg/m  Physical Exam Vitals and nursing note reviewed.  Constitutional:      Appearance: Normal appearance.  HENT:     Head: Normocephalic and atraumatic.     Nose: Nose normal.     Mouth/Throat:     Mouth: Mucous membranes are moist.  Eyes:     Extraocular Movements: Extraocular movements intact.     Conjunctiva/sclera: Conjunctivae normal.     Pupils: Pupils are equal,  round, and reactive to light.  Cardiovascular:     Rate and Rhythm: Normal rate and regular rhythm.     Pulses: Normal pulses.     Heart sounds: Normal heart sounds. No murmur heard.    No gallop.  Pulmonary:     Effort: Pulmonary effort is normal. No respiratory distress.     Breath sounds: Normal breath sounds. No stridor. No wheezing, rhonchi or rales.  Abdominal:     General: Abdomen is flat. Bowel sounds are normal. There is no distension.     Palpations: Abdomen is soft.     Tenderness: There is no abdominal tenderness. There is no guarding.  Musculoskeletal:        General: Normal range of motion.     Cervical back: Normal range of motion and neck supple.  Skin:    General: Skin is warm and dry.  Neurological:     General: No focal deficit present.     Mental Status: She is alert and oriented to person, place, and time. Mental status is at baseline.     Motor: No weakness.  Psychiatric:        Mood and Affect: Mood normal.        Behavior: Behavior normal.        Thought Content: Thought content normal.  Judgment: Judgment normal.     ED Results / Procedures / Treatments   Labs (all labs ordered are listed, but only abnormal results are displayed) Labs Reviewed  URINALYSIS, ROUTINE W REFLEX MICROSCOPIC - Abnormal; Notable for the following components:      Result Value   Specific Gravity, Urine 1.032 (*)    Glucose, UA >=500 (*)    Hgb urine dipstick SMALL (*)    Bacteria, UA RARE (*)    All other components within normal limits  BLOOD GAS, VENOUS - Abnormal; Notable for the following components:   pCO2, Ven 39 (*)    pO2, Ven 53 (*)    All other components within normal limits  CBG MONITORING, ED - Abnormal; Notable for the following components:   Glucose-Capillary 547 (*)    All other components within normal limits  CBC  COMPREHENSIVE METABOLIC PANEL WITH GFR  BETA-HYDROXYBUTYRIC ACID  CBG MONITORING, ED    EKG None  Radiology No results  found.  Procedures Procedures    Medications Ordered in ED Medications  lactated ringers bolus 1,000 mL (1,000 mLs Intravenous New Bag/Given 06/20/23 1316)    ED Course/ Medical Decision Making/ A&P                                 Medical Decision Making Amount and/or Complexity of Data Reviewed Labs: ordered.  Risk Prescription drug management.   This patient presents to the ED for concern of glycemia differential diagnosis includes diabetes, DKA, HHS    Additional history obtained:  Additional history obtained from family External records from outside source obtained and reviewed including medical records   Lab Tests:  I Ordered, and personally interpreted labs.  The pertinent results include: Hyperglycemia, no leukocytosis or anemia, normal potassium, normal bicarb and anion gap, normal kidney function liver function, unremarkable urinalysis    Medicines ordered and prescription drug management:  I ordered medication including IV fluids and insulin for hyperglycemia Reevaluation of the patient after these medicines showed that the patient improved I have reviewed the patients home medicines and have made adjustments as needed   Problem List / ED Course:  Patient is feeling better at this time.  Discussed with patient to start taking her metformin as directed and to follow-up closely with her primary care doctor for reevaluation.  Patient does not meet criteria for DKA or HHS at this time.  Vital signs are stable at this point with no indication for sepsis.  Do not suspect any further emergent workup is warranted at this time.  Strict turn precautions were provided for any new or worsening symptoms.  Patient voiced understanding to the plan and had no additional questions.   Social Determinants of Health:  None           Final Clinical Impression(s) / ED Diagnoses Final diagnoses:  None    Rx / DC Orders ED Discharge Orders     None          Lelon Perla, PA-C 06/20/23 1607    Eber Hong, MD 06/21/23 [phone]

## 2023-06-20 NOTE — ED Notes (Signed)
 Date and time results received: 06/20/23 1307   Test: I-Stat Chem 8 Critical Value: CBG 526  Name of Provider Notified: Eber Hong , MD

## 2023-06-20 NOTE — ED Notes (Signed)
 ED Provider at bedside.

## 2023-06-20 NOTE — ED Notes (Signed)
 Water given with Pas approval

## 2023-06-21 LAB — MICROALBUMIN / CREATININE URINE RATIO
Creatinine, Urine: 41.6 mg/dL
Microalb Creat Ratio: 14 mg/g{creat} (ref 0–29)
Microalb, Ur: 5.8 ug/mL — ABNORMAL HIGH

## 2023-09-18 ENCOUNTER — Other Ambulatory Visit (HOSPITAL_COMMUNITY)
Admission: RE | Admit: 2023-09-18 | Discharge: 2023-09-18 | Disposition: A | Discharge status: Home / Self Care | Source: Ambulatory Visit | Attending: Internal Medicine | Admitting: Internal Medicine

## 2023-09-18 DIAGNOSIS — N390 Urinary tract infection, site not specified: Secondary | ICD-10-CM | POA: Insufficient documentation

## 2023-09-19 LAB — URINE CULTURE

## 2023-12-20 ENCOUNTER — Telehealth: Payer: Self-pay

## 2023-12-20 ENCOUNTER — Telehealth: Payer: Self-pay | Admitting: Internal Medicine

## 2023-12-20 NOTE — Telephone Encounter (Signed)
 Chronic condition form Copied Noted Sleeved  Original placed front desk completed folder Copy placed front desk copy folder

## 2023-12-20 NOTE — Telephone Encounter (Signed)
 Copied from CRM [id number]. Topic: Appointments - Transfer of Care >> Dec 20, 2023  2:53 PM Roselie BROCKS wrote: Pt is requesting to transfer FROM: Dr Jerilynn Carnes, [facility] ,  Pt is requesting to transfer TO: Dr Meade Gerlach  Reason for requested transfer: Doctor Retired  It is the responsibility of the team the patient would like to transfer to (Dr. Dr Meade Gerlach ) to reach out to the patient if for any reason this transfer is not acceptable.

## 2023-12-21 ENCOUNTER — Other Ambulatory Visit: Payer: Self-pay | Admitting: Family Medicine

## 2023-12-21 NOTE — Telephone Encounter (Signed)
 Can not fill out paperwork until new patient appointment

## 2024-01-02 DIAGNOSIS — E119 Type 2 diabetes mellitus without complications: Secondary | ICD-10-CM | POA: Diagnosis not present

## 2024-01-02 DIAGNOSIS — Z6841 Body Mass Index (BMI) 40.0 and over, adult: Secondary | ICD-10-CM | POA: Diagnosis not present

## 2024-01-02 DIAGNOSIS — F32A Depression, unspecified: Secondary | ICD-10-CM | POA: Diagnosis not present

## 2024-04-23 ENCOUNTER — Ambulatory Visit: Admitting: Family Medicine
